# Patient Record
Sex: Female | Born: 1945 | Race: White | Hispanic: No | Marital: Married | Smoking: Current every day smoker
Health system: Southern US, Community
[De-identification: ages and names within clinical notes are randomized; demographics above are authoritative.]

## PROBLEM LIST (undated history)

## (undated) DIAGNOSIS — I712 Thoracic aortic aneurysm, without rupture: Secondary | ICD-10-CM

## (undated) DIAGNOSIS — M1990 Unspecified osteoarthritis, unspecified site: Secondary | ICD-10-CM

## (undated) DIAGNOSIS — M4712 Other spondylosis with myelopathy, cervical region: Secondary | ICD-10-CM

## (undated) DIAGNOSIS — J449 Chronic obstructive pulmonary disease, unspecified: Secondary | ICD-10-CM

## (undated) DIAGNOSIS — I10 Essential (primary) hypertension: Secondary | ICD-10-CM

## (undated) DIAGNOSIS — M5481 Occipital neuralgia: Secondary | ICD-10-CM

## (undated) DIAGNOSIS — E871 Hypo-osmolality and hyponatremia: Secondary | ICD-10-CM

## (undated) DIAGNOSIS — N189 Chronic kidney disease, unspecified: Secondary | ICD-10-CM

## (undated) HISTORY — DX: Other spondylosis with myelopathy, cervical region: M47.12

## (undated) HISTORY — DX: Occipital neuralgia: M54.81

## (undated) HISTORY — DX: Unspecified osteoarthritis, unspecified site: M19.90

## (undated) HISTORY — DX: Thoracic aortic aneurysm, without rupture: I71.2

## (undated) HISTORY — PX: BACK SURGERY: SHX140

## (undated) HISTORY — DX: Chronic obstructive pulmonary disease, unspecified: J44.9

## (undated) HISTORY — DX: Essential (primary) hypertension: I10

## (undated) HISTORY — PX: SPINAL FUSION: SHX223

## (undated) HISTORY — DX: Hypo-osmolality and hyponatremia: E87.1

---

## 1967-02-16 HISTORY — PX: CHOLECYSTECTOMY: SHX55

## 1997-05-03 ENCOUNTER — Ambulatory Visit (HOSPITAL_COMMUNITY): Admission: RE | Admit: 1997-05-03 | Discharge: 1997-05-03 | Payer: Self-pay | Admitting: Gastroenterology

## 1998-02-24 ENCOUNTER — Encounter: Payer: Self-pay | Admitting: Neurosurgery

## 1998-02-25 ENCOUNTER — Inpatient Hospital Stay (HOSPITAL_COMMUNITY): Admission: RE | Admit: 1998-02-25 | Discharge: 1998-02-26 | Payer: Self-pay | Admitting: Neurosurgery

## 1998-02-25 ENCOUNTER — Encounter: Payer: Self-pay | Admitting: Neurosurgery

## 1998-11-18 ENCOUNTER — Other Ambulatory Visit: Admission: RE | Admit: 1998-11-18 | Discharge: 1998-11-18 | Payer: Self-pay | Admitting: Obstetrics and Gynecology

## 1998-12-16 ENCOUNTER — Encounter: Payer: Self-pay | Admitting: Obstetrics and Gynecology

## 1998-12-16 ENCOUNTER — Encounter: Admission: RE | Admit: 1998-12-16 | Discharge: 1998-12-16 | Payer: Self-pay | Admitting: Obstetrics and Gynecology

## 2000-01-06 ENCOUNTER — Other Ambulatory Visit: Admission: RE | Admit: 2000-01-06 | Discharge: 2000-01-06 | Payer: Self-pay | Admitting: Obstetrics and Gynecology

## 2000-06-09 ENCOUNTER — Emergency Department (HOSPITAL_COMMUNITY): Admission: EM | Admit: 2000-06-09 | Discharge: 2000-06-09 | Payer: Self-pay | Admitting: Emergency Medicine

## 2001-02-15 HISTORY — PX: SPINAL FUSION: SHX223

## 2001-02-16 ENCOUNTER — Other Ambulatory Visit: Admission: RE | Admit: 2001-02-16 | Discharge: 2001-02-16 | Payer: Self-pay | Admitting: Obstetrics and Gynecology

## 2002-01-01 ENCOUNTER — Inpatient Hospital Stay (HOSPITAL_COMMUNITY)
Admission: RE | Admit: 2002-01-01 | Discharge: 2002-01-05 | Payer: Self-pay | Admitting: Physical Medicine & Rehabilitation

## 2005-05-17 ENCOUNTER — Ambulatory Visit (HOSPITAL_COMMUNITY): Admission: RE | Admit: 2005-05-17 | Discharge: 2005-05-17 | Payer: Self-pay | Admitting: Obstetrics and Gynecology

## 2005-05-28 ENCOUNTER — Encounter: Admission: RE | Admit: 2005-05-28 | Discharge: 2005-05-28 | Payer: Self-pay | Admitting: Neurosurgery

## 2005-06-02 ENCOUNTER — Encounter: Admission: RE | Admit: 2005-06-02 | Discharge: 2005-06-02 | Payer: Self-pay | Admitting: *Deleted

## 2005-09-15 ENCOUNTER — Other Ambulatory Visit: Admission: RE | Admit: 2005-09-15 | Discharge: 2005-09-15 | Payer: Self-pay | Admitting: Obstetrics and Gynecology

## 2006-12-22 ENCOUNTER — Encounter: Admission: RE | Admit: 2006-12-22 | Discharge: 2006-12-22 | Payer: Self-pay | Admitting: Neurosurgery

## 2007-11-12 ENCOUNTER — Encounter: Admission: RE | Admit: 2007-11-12 | Discharge: 2007-11-12 | Payer: Self-pay | Admitting: Neurosurgery

## 2007-11-19 ENCOUNTER — Encounter: Admission: RE | Admit: 2007-11-19 | Discharge: 2007-11-19 | Payer: Self-pay | Admitting: Neurosurgery

## 2010-01-20 ENCOUNTER — Ambulatory Visit: Payer: Self-pay | Admitting: Cardiology

## 2010-02-03 ENCOUNTER — Encounter: Payer: Self-pay | Admitting: Cardiology

## 2010-02-03 DIAGNOSIS — R609 Edema, unspecified: Secondary | ICD-10-CM

## 2010-02-04 ENCOUNTER — Encounter: Payer: Self-pay | Admitting: Cardiology

## 2010-02-04 ENCOUNTER — Ambulatory Visit (HOSPITAL_COMMUNITY)
Admission: RE | Admit: 2010-02-04 | Discharge: 2010-02-04 | Payer: Self-pay | Source: Home / Self Care | Attending: Cardiology | Admitting: Cardiology

## 2010-02-04 ENCOUNTER — Ambulatory Visit: Payer: Self-pay

## 2010-03-19 NOTE — Miscellaneous (Signed)
Summary: Orders Update  Clinical Lists Changes  Problems: Added new problem of PERIPHERAL EDEMA (ICD-782.3) Orders: Added new Test order of Venous Duplex Lower Extremity (Venous Duplex Lower) - Signed

## 2010-05-07 ENCOUNTER — Other Ambulatory Visit: Payer: Self-pay | Admitting: Neurosurgery

## 2010-05-07 DIAGNOSIS — M549 Dorsalgia, unspecified: Secondary | ICD-10-CM

## 2010-05-07 DIAGNOSIS — M542 Cervicalgia: Secondary | ICD-10-CM

## 2010-05-09 ENCOUNTER — Ambulatory Visit
Admission: RE | Admit: 2010-05-09 | Discharge: 2010-05-09 | Disposition: A | Discharge status: Home / Self Care | Payer: Medicare Other | Source: Ambulatory Visit | Attending: Neurosurgery | Admitting: Neurosurgery

## 2010-05-09 DIAGNOSIS — M542 Cervicalgia: Secondary | ICD-10-CM

## 2010-05-09 DIAGNOSIS — M549 Dorsalgia, unspecified: Secondary | ICD-10-CM

## 2010-07-03 NOTE — Discharge Summary (Signed)
NAME:  Regina [name], Regina [name]                        ACCOUNT NO.:  [id number]   MEDICAL RECORD NO.:  [id number]                   PATIENT TYPE:  IPS   LOCATION:  4032                                 FACILITY:  [facility]   PHYSICIAN:  Ellwood Dense, M.D.                DATE OF BIRTH:  03-01-1945   DATE OF ADMISSION:  01/01/2002  DATE OF DISCHARGE:  01/05/2002                                 DISCHARGE SUMMARY   DISCHARGE DIAGNOSES:  1. Cervical fusion redo from C1 to T1.  2. Enterobacter urinary tract infection.  3. Hypokalemia, resolved.   HISTORY OF PRESENT ILLNESS:  The patient is a 65 year old female with a  history of GERD, ___________ for approximately three years ago, who did well  initially, in the past year has had cervical myelopathy with quadriparesis,  cord compression and spondylolisthesis.  She elected to undergo posterior  cervical fusion from occiput to T1 with decompression and graft by Dr. Elvina Sidle  at [facility] on December 26, 2001.  Halo was placed  postop for stabilization.  Past issues and past surgeries have include  electrolyte abnormalities with hyponatremia as well as occasional headaches.  Per reports, halo to continue to remain in place for 10 to 12 weeks with  followup with Dr. Elvina Sidle in 6 to 8 weeks.  PT was initiated and the patient is  noted to have fine motor skill problems.  Motor strength is improving.  She  is minimal assist for transfers, has been ambulating 25 feet with rolling  walker with contact guard assist with narrow base of stance.   PAST MEDICAL HISTORY:  Past medical history significant for GERD, history of  DA with cervical myelopathy and instability, allergies and urinary  retention.   ALLERGIES:  Allergies are to [facility] and NICKEL.   SOCIAL HISTORY:  The patient is married and was working as a Engineer, civil (consulting) prior to  admission, was ambulating without assistive device.  She lives in a one-  level home with three steps to entry, smokes  one pack per day, has one to  two alcoholic drinks a month.   HOSPITAL COURSE:  The patient was admitted to rehab on January 01, 2002 for  inpatient therapies to consent of PT and OT daily.  Past admission, she was  maintained on subcu Lovenox for DVT prophylaxis.  Sodium chloride pills were  continued throughout her stay.  Labs done at admission showed sodium at 131,  potassium at 3.3.  CBC showed hemoglobin 10.7, hematocrit 31.5, white count  7.3, platelets 291,000.  The patient's pin sites were noted to be clean and  dry, no drainage and no infection noted.  Followup on electrolytes showed  the patient's hyponatremia to be slowly improving with sodium at 133,  potassium 4.1, chloride 96, CO2 30, BUN 4, creatinine 0.5, glucose 108.  She  was taken off sodium chloride pills at time of discharge.  UA/UCS done past  admission showed the patient to have 25,000 colonies of Enterobacter  aerogenes; she was treated with a three-day course of Cipro for this.  During her stay in rehab, the patient made good process.  She was close  supervision for transfers, close supervision for ambulating 200 feet with a  rolling walker.  She required minimal assist for one loss of balance.  She  is noted to have some impulsivity, especially with the use of a walker at 4  feet and in terms of safety.  She has refused to use her AFO that she was  advised to her years ago.  The patient was supervision for ADL needs.  Secondary to her impulsivity and problems with judgment, supervision is  recommended past discharge and family is to provide this.  Further followup  PT is to continue via [facility] past discharge.  On January 05, 2002, the patient is discharged to home.    DISCHARGE MEDICATIONS:  1. ____________ 0.4 mg q.h.s.  2. Percocet one to two every four to six hours p.r.n. pain.  3. Multivitamin one per day.  4. Protonix 40 mg b.i.d.  5. Cipro 250 mg b.i.d.   ACTIVITY:  Supervision  level.   DIET:  Regular.   WOUND CARE:  Keep clean ____________ tap water and hydrogen peroxide b.i.d.  basis and apply Betadine.   SPECIAL INSTRUCTIONS:  No alcohol, no smoking, no driving.   FOLLOWUP:  The patient is to follow up with Dr. Dorita Fray on Monday, follow  up with Dr. Lamar Benes as needed.      Greg Cutter, P.A.                    Ellwood Dense, M.D.    PP/MEDQ  D:  02/27/2002  T:  02/28/2002  Job:  [id number]   cc:   Marjory Lies, M.D.  [address]  [place]  [place] [address]  Fax: [phone]   Payton Doughty, M.D.  [address]  [place]  [place] [address]  Fax: [phone]   [facility] Elvina Sidle, Myna Hidalgo.D.

## 2011-07-02 ENCOUNTER — Other Ambulatory Visit: Payer: Self-pay | Admitting: Neurosurgery

## 2011-07-02 DIAGNOSIS — M4712 Other spondylosis with myelopathy, cervical region: Secondary | ICD-10-CM

## 2011-07-08 ENCOUNTER — Other Ambulatory Visit: Payer: PRIVATE HEALTH INSURANCE

## 2011-07-08 ENCOUNTER — Other Ambulatory Visit: Payer: Self-pay | Admitting: Neurosurgery

## 2011-07-08 ENCOUNTER — Ambulatory Visit
Admission: RE | Admit: 2011-07-08 | Discharge: 2011-07-08 | Disposition: A | Discharge status: Home / Self Care | Payer: Medicare Other | Source: Ambulatory Visit | Attending: Neurosurgery | Admitting: Neurosurgery

## 2011-07-08 ENCOUNTER — Ambulatory Visit
Admission: RE | Admit: 2011-07-08 | Discharge: 2011-07-08 | Disposition: A | Discharge status: Home / Self Care | Payer: PRIVATE HEALTH INSURANCE | Source: Ambulatory Visit | Attending: Neurosurgery | Admitting: Neurosurgery

## 2011-07-08 DIAGNOSIS — M4712 Other spondylosis with myelopathy, cervical region: Secondary | ICD-10-CM

## 2011-07-22 ENCOUNTER — Other Ambulatory Visit: Payer: Self-pay | Admitting: Neurosurgery

## 2011-07-22 DIAGNOSIS — D491 Neoplasm of unspecified behavior of respiratory system: Secondary | ICD-10-CM

## 2011-07-23 ENCOUNTER — Ambulatory Visit
Admission: RE | Admit: 2011-07-23 | Discharge: 2011-07-23 | Disposition: A | Discharge status: Home / Self Care | Payer: Medicare Other | Source: Ambulatory Visit | Attending: Neurosurgery | Admitting: Neurosurgery

## 2011-07-23 DIAGNOSIS — D491 Neoplasm of unspecified behavior of respiratory system: Secondary | ICD-10-CM

## 2011-08-09 ENCOUNTER — Other Ambulatory Visit: Payer: Self-pay | Admitting: Neurosurgery

## 2011-08-09 DIAGNOSIS — M5481 Occipital neuralgia: Secondary | ICD-10-CM

## 2011-08-23 ENCOUNTER — Ambulatory Visit
Admission: RE | Admit: 2011-08-23 | Discharge: 2011-08-23 | Disposition: A | Discharge status: Home / Self Care | Payer: Medicare Other | Source: Ambulatory Visit | Attending: Neurosurgery | Admitting: Neurosurgery

## 2011-08-23 DIAGNOSIS — I7121 Aneurysm of the ascending aorta, without rupture: Secondary | ICD-10-CM

## 2011-08-23 DIAGNOSIS — M5481 Occipital neuralgia: Secondary | ICD-10-CM | POA: Insufficient documentation

## 2011-08-23 DIAGNOSIS — I10 Essential (primary) hypertension: Secondary | ICD-10-CM | POA: Insufficient documentation

## 2011-08-23 DIAGNOSIS — J449 Chronic obstructive pulmonary disease, unspecified: Secondary | ICD-10-CM

## 2011-08-23 DIAGNOSIS — I712 Thoracic aortic aneurysm, without rupture: Secondary | ICD-10-CM

## 2011-08-23 DIAGNOSIS — M4712 Other spondylosis with myelopathy, cervical region: Secondary | ICD-10-CM | POA: Insufficient documentation

## 2011-08-23 HISTORY — DX: Chronic obstructive pulmonary disease, unspecified: J44.9

## 2011-08-23 HISTORY — DX: Aneurysm of the ascending aorta, without rupture: I71.21

## 2011-08-23 HISTORY — DX: Thoracic aortic aneurysm, without rupture: I71.2

## 2011-08-23 MED ORDER — DEXAMETHASONE SODIUM PHOSPHATE 4 MG/ML IJ SOLN
4.0000 mg | Freq: Once | INTRAMUSCULAR | Status: AC
  Administered 2011-08-23: 4 mg

## 2011-08-24 ENCOUNTER — Institutional Professional Consult (permissible substitution) (INDEPENDENT_AMBULATORY_CARE_PROVIDER_SITE_OTHER): Payer: Medicare Other | Admitting: Thoracic Surgery (Cardiothoracic Vascular Surgery)

## 2011-08-24 ENCOUNTER — Encounter: Payer: Self-pay | Admitting: Thoracic Surgery (Cardiothoracic Vascular Surgery)

## 2011-08-24 VITALS — BP 129/87 | HR 69 | Resp 18 | Ht 60.0 in | Wt <= 1120 oz

## 2011-08-24 DIAGNOSIS — I712 Thoracic aortic aneurysm, without rupture: Secondary | ICD-10-CM

## 2011-08-24 NOTE — Progress Notes (Signed)
PCP is Delorse Lek, MD Referring Provider is Trey Sailors, MD  Chief Complaint  Patient presents with  . Thoracic Aortic Aneurysm    Referral from Dr Channing Mutters for eval on ascending aorta aneurysm, Chest CT 07/23/11    HPI: 66 year old Regina [name] who presents with a complaint of aortic aneurysm.  Regina Regina [name] with a history of tobacco abuse, COPD, and degenerative disease of the spine with a previous spinal fusion. She recently had a CT to assess her spine it was incidentally noted to have a 3.9 cm descending aortic aneurysm. She denies any chest pain or shortness of breath. She did have an echocardiogram in 2011 which showed no evidence of valvular disease and also no evidence of significant aortic pathology at that time. She is very anxious about the aneurysm. She actually smokes 1.5 packs of cigarettes daily and expresses no interested in quitting.  Past Medical History  Diagnosis Date  . Hypertension   . Hyponatremia   . Ascending aortic aneurysm 08/23/2011  . COPD (chronic obstructive pulmonary disease) 08/23/2011  . Cervical spondylosis with myelopathy   . Occipital neuralgia     Past Surgical History  Procedure Date  . Back surgery     multiple  . Spinal fusion     cervical fusion    Family History  Problem Relation Age of Onset  . Heart attack Father   . Stroke Mother     Social History History  Substance Use Topics  . Smoking status: Current Everyday Smoker -- 1.0 packs/day for 48 years    Types: Cigarettes  . Smokeless tobacco: Not on file  . Alcohol Use: 7.0 oz/week    14 drink(s) per week    Current Outpatient Prescriptions  Medication Sig Dispense Refill  . acetaminophen (TYLENOL) 500 MG tablet Take 500 mg by mouth as needed.      Marland Kitchen aspirin-acetaminophen-caffeine (EXCEDRIN MIGRAINE) 250-250-65 MG per tablet Take 1 tablet by mouth as needed.      . Calcium Carbonate-Vitamin D (CALTRATE 600+D PO) Take 1 tablet by mouth.      . clorazepate (TRANXENE) 3.75 MG tablet  Take 3.75 mg by mouth 3 (three) times daily.       Marland Kitchen dextromethorphan-guaiFENesin (MUCINEX DM) 30-600 MG per 12 hr tablet Take 1 tablet by mouth every 12 (twelve) hours.      . Multiple Vitamin (MULITIVITAMIN WITH MINERALS) TABS Take 1 tablet by mouth daily.       No current facility-administered medications for this visit.   Facility-Administered Medications Ordered in Other Visits  Medication Dose Route Frequency Provider Last Rate Last Dose  . dexamethasone (DECADRON) injection 4 mg  4 mg Other Once Elsie Stain, MD   4 mg at 08/23/11 1018    Allergies  Allergen Reactions  . Erythromycin Rash    Review of Systems  Constitutional:       Sedentary, does not exercise  Respiratory: Negative for shortness of breath and wheezing.   Cardiovascular: Negative for chest pain and leg swelling.  Gastrointestinal:       Difficulty swallowing- esophageal dilatation by Dr. Arlyce Dice  Genitourinary: Positive for urgency and frequency.       "leaky bladder"  Musculoskeletal: Positive for back pain and arthralgias.  All other systems reviewed and are negative.    BP 129/87  Pulse 69  Resp 18  Ht 5' (1.524 m)  Wt 64 lb (29.03 kg)  BMI 12.50 kg/m2  SpO2 92% Physical Exam  Vitals reviewed. Constitutional: She is  oriented to person, place, and time. No distress.       Thin, frail  HENT:  Head: Normocephalic and atraumatic.  Eyes: EOM are normal. Pupils are equal, round, and reactive to light.  Neck: No thyromegaly present.       Limited ROM  Cardiovascular: Normal rate, regular rhythm, normal heart sounds and intact distal pulses.   No murmur heard. Pulmonary/Chest: She has no wheezes.       Diminished BS bilaterally  Abdominal: Soft. There is no tenderness.  Musculoskeletal: She exhibits no edema.  Lymphadenopathy:    She has no cervical adenopathy.  Neurological: She is alert and oriented to person, place, and time. No cranial nerve deficit.  Skin: Skin is warm and dry.      Diagnostic Tests: CT of chest from 07/23/2011 reviewed  IMPRESSION:  Severe COPD changes. Biapical pleural/parenchymal densities, right  slightly greater than left. I favor scarring. This can be  followed with repeat chest CT in 6 months to assure stability.  4.0 cm ascending aortic aneurysm.  Coronary artery disease.   Impression: 66 year old Regina [name] with a 4 cm ascending aortic aneurysm. This does not meet size criteria for surgical resection. At this point her risk from surgery would far outweigh the risk from the aneurysm. Normally in the ascending aorta we would wait until this was 5.5-6 cm before recommending surgery. However she is very small and if her aneurysm to 5 cm, I would be very concerned. She is a poor surgical candidate given her small size, cachexia, and COPD with ongoing tobacco abuse. I did talk with her about quitting smoking. She expressed no interest whatsoever.  The radiologist also noted pleural and parenchymal densities in both apices and recommended a CT followup in 6 months. We'll plan to do a CT Angiogram at that time to assess both the apices and the ascending aortic aneurysm.  Plan: Return in 6 months with CT angiogram of the chest.

## 2012-01-25 ENCOUNTER — Other Ambulatory Visit (HOSPITAL_COMMUNITY): Payer: Self-pay | Admitting: Family Medicine

## 2012-01-25 DIAGNOSIS — M8580 Other specified disorders of bone density and structure, unspecified site: Secondary | ICD-10-CM

## 2012-01-25 DIAGNOSIS — Z1231 Encounter for screening mammogram for malignant neoplasm of breast: Secondary | ICD-10-CM

## 2012-02-17 ENCOUNTER — Other Ambulatory Visit: Payer: Self-pay | Admitting: Thoracic Surgery (Cardiothoracic Vascular Surgery)

## 2012-02-17 DIAGNOSIS — I712 Thoracic aortic aneurysm, without rupture: Secondary | ICD-10-CM

## 2012-02-18 ENCOUNTER — Other Ambulatory Visit: Payer: Self-pay | Admitting: Thoracic Surgery (Cardiothoracic Vascular Surgery)

## 2012-02-18 ENCOUNTER — Other Ambulatory Visit: Payer: Self-pay

## 2012-02-18 DIAGNOSIS — I712 Thoracic aortic aneurysm, without rupture: Secondary | ICD-10-CM

## 2012-02-23 ENCOUNTER — Other Ambulatory Visit: Payer: Self-pay | Admitting: Neurosurgery

## 2012-02-23 DIAGNOSIS — M5481 Occipital neuralgia: Secondary | ICD-10-CM

## 2012-02-25 ENCOUNTER — Ambulatory Visit (HOSPITAL_COMMUNITY)
Admission: RE | Admit: 2012-02-25 | Discharge: 2012-02-25 | Disposition: A | Discharge status: Home / Self Care | Payer: Medicare Other | Source: Ambulatory Visit | Attending: Family Medicine | Admitting: Family Medicine

## 2012-02-25 DIAGNOSIS — Z1231 Encounter for screening mammogram for malignant neoplasm of breast: Secondary | ICD-10-CM | POA: Insufficient documentation

## 2012-02-25 DIAGNOSIS — M949 Disorder of cartilage, unspecified: Secondary | ICD-10-CM | POA: Insufficient documentation

## 2012-02-25 DIAGNOSIS — M899 Disorder of bone, unspecified: Secondary | ICD-10-CM | POA: Insufficient documentation

## 2012-02-25 DIAGNOSIS — Z780 Asymptomatic menopausal state: Secondary | ICD-10-CM | POA: Insufficient documentation

## 2012-02-25 DIAGNOSIS — M8580 Other specified disorders of bone density and structure, unspecified site: Secondary | ICD-10-CM

## 2012-02-25 DIAGNOSIS — Z13820 Encounter for screening for osteoporosis: Secondary | ICD-10-CM | POA: Insufficient documentation

## 2012-03-01 ENCOUNTER — Other Ambulatory Visit: Payer: Self-pay | Admitting: Neurosurgery

## 2012-03-01 ENCOUNTER — Ambulatory Visit
Admission: RE | Admit: 2012-03-01 | Discharge: 2012-03-01 | Disposition: A | Discharge status: Home / Self Care | Payer: Medicare Other | Source: Ambulatory Visit | Attending: Neurosurgery | Admitting: Neurosurgery

## 2012-03-01 DIAGNOSIS — M5481 Occipital neuralgia: Secondary | ICD-10-CM

## 2012-03-01 MED ORDER — IOHEXOL 180 MG/ML  SOLN
1.0000 mL | Freq: Once | INTRAMUSCULAR | Status: AC | PRN
  Administered 2012-03-01: 1 mL

## 2012-03-01 MED ORDER — DEXAMETHASONE SODIUM PHOSPHATE 4 MG/ML IJ SOLN
6.0000 mg | Freq: Once | INTRAMUSCULAR | Status: AC
  Administered 2012-03-01: 6 mg

## 2012-03-02 ENCOUNTER — Other Ambulatory Visit: Payer: Self-pay | Admitting: Thoracic Surgery (Cardiothoracic Vascular Surgery)

## 2012-03-02 LAB — BUN: BUN: 7 mg/dL (ref 6–23)

## 2012-03-07 ENCOUNTER — Encounter: Payer: Self-pay | Admitting: Thoracic Surgery (Cardiothoracic Vascular Surgery)

## 2012-03-07 ENCOUNTER — Ambulatory Visit
Admission: RE | Admit: 2012-03-07 | Discharge: 2012-03-07 | Disposition: A | Discharge status: Home / Self Care | Payer: Medicare Other | Source: Ambulatory Visit | Attending: Thoracic Surgery (Cardiothoracic Vascular Surgery) | Admitting: Thoracic Surgery (Cardiothoracic Vascular Surgery)

## 2012-03-07 ENCOUNTER — Ambulatory Visit (INDEPENDENT_AMBULATORY_CARE_PROVIDER_SITE_OTHER): Payer: Medicare Other | Admitting: Thoracic Surgery (Cardiothoracic Vascular Surgery)

## 2012-03-07 VITALS — BP 130/95 | HR 62 | Resp 16 | Ht 60.0 in | Wt <= 1120 oz

## 2012-03-07 DIAGNOSIS — I712 Thoracic aortic aneurysm, without rupture, unspecified: Secondary | ICD-10-CM

## 2012-03-07 MED ORDER — IOHEXOL 350 MG/ML SOLN
75.0000 mL | Freq: Once | INTRAVENOUS | Status: AC | PRN
  Administered 2012-03-07: 75 mL via INTRAVENOUS

## 2012-03-07 NOTE — Progress Notes (Signed)
HPI:  Liddicoat returns today for followup of an ascending aortic aneurysm. This was found incidentally on CT scan of her spine 6 months ago. That led to a CT of the chest which showed an ascending aortic aneurysm with maximal diameter of 3.9 cm. She now returns for 6 month followup. She said that since her last visit she's not had any chest pain. She does have issues related to bone density and apparently is going to have some intravenous therapy for that.  Past Medical History  Diagnosis Date  . Hypertension   . Hyponatremia   . Ascending aortic aneurysm 08/23/2011  . COPD (chronic obstructive pulmonary disease) 08/23/2011  . Cervical spondylosis with myelopathy   . Occipital neuralgia      Current Outpatient Prescriptions  Medication Sig Dispense Refill  . acetaminophen-codeine (TYLENOL #3) 300-30 MG per tablet Take 1 tablet by mouth every 6 (six) hours as needed.      Marland Kitchen aspirin-acetaminophen-caffeine (EXCEDRIN MIGRAINE) 250-250-65 MG per tablet Take 1 tablet by mouth as needed.      . Calcium Carbonate-Vitamin D (CALTRATE 600+D PO) Take 1 tablet by mouth.      . cholecalciferol (VITAMIN D) 1000 UNITS tablet Take 1,000 Units by mouth daily.      . clorazepate (TRANXENE) 3.75 MG tablet Take 3.75 mg by mouth 3 (three) times daily.       Marland Kitchen dextromethorphan-guaiFENesin (MUCINEX DM) 30-600 MG per 12 hr tablet Take 1 tablet by mouth every 12 (twelve) hours.      . Multiple Vitamin (MULITIVITAMIN WITH MINERALS) TABS Take 1 tablet by mouth daily.        Physical Exam BP 130/95  Pulse 62  Resp 16  Ht 5' (1.524 m)  Wt 64 lb (29.03 kg)  BMI 12.50 kg/m2 Frail-appearing 67 year old woman in no acute distress Neurologic alert and oriented x3 Cardiac regular rate and rhythm normal S1 and S2 Lungs diminished breath sounds bilaterally, no wheezes  Diagnostic Tests: CT angiogram of chest 03/10/2012  CT ANGIOGRAPHY CHEST  Technique: Multidetector CT imaging of the chest using the  standard  protocol during bolus administration of intravenous  contrast. Multiplanar reconstructed images including MIPs were  obtained and reviewed to evaluate the vascular anatomy.  Contrast: 75mL OMNIPAQUE IOHEXOL 350 MG/ML SOLN  Comparison: 07/23/2011  Findings: Ascending aorta 3 cm at the sinotubular junction, 4 cm  proximal ascending ( previously 4 cm), 3.7 cm distal ascending, 3.2  cm proximal arch, 2.6 cm distal arch, 2.6 cm proximal descending,  2.1 cm distal descending just above the diaphragm. There is mild  atheromatous irregularity in the proximal and distal descending  segments. No dissection or stenosis. Patchy coronary  calcifications. Wide patency of brachiocephalic arterial origins.  The left vertebral artery originates directly from the aortic arch,  an anatomic variant.  There is good contrast opacification of the pulmonary artery  branches; exam was not optimized for detection of pulmonary emboli.  No hilar or mediastinal adenopathy. No pleural or pericardial  effusion. Lungs are hyperinflated. There is some subpleural  scarring or subsegmental atelectasis posteriorly in both lower  lobes. Lungs otherwise clear. Mild central intrahepatic biliary  ductal dilatation is noted, incompletely visualized. The remainder  of the visualized upper abdomen is unremarkable. Thoracic spine  and sternum sternum intact.  IMPRESSION:  1. Stable 4 cm ascending aortic aneurysm.  2. Central intrahepatic biliary ductal dilatation, incompletely  visualized. Correlate with any clinical or laboratory evidence  of biliary obstruction.  Original Report Authenticated By:  Corlis Leak III, MD  Impression: 67 year old woman with a known 3.9-4 cm ascending aortic aneurysm. There is been no interval change in the size or appearance of the aorta in 6 months. I would recommend a followup in one year.   Plan: Return in one year with MRA of the chest.

## 2012-03-07 NOTE — Patient Instructions (Signed)
Return in one year with an MR angiogram of the chest

## 2012-06-01 ENCOUNTER — Encounter: Payer: Self-pay | Admitting: Cardiology

## 2012-06-01 ENCOUNTER — Ambulatory Visit (INDEPENDENT_AMBULATORY_CARE_PROVIDER_SITE_OTHER): Payer: Medicare Other | Admitting: Cardiology

## 2012-06-01 VITALS — BP 148/98 | HR 78 | Ht 60.0 in | Wt <= 1120 oz

## 2012-06-01 DIAGNOSIS — R609 Edema, unspecified: Secondary | ICD-10-CM

## 2012-06-01 DIAGNOSIS — I872 Venous insufficiency (chronic) (peripheral): Secondary | ICD-10-CM

## 2012-06-01 NOTE — Patient Instructions (Signed)
Wear thigh high compression hose.  Restrict sodium intake.  Keep your feet elevated as much as possible  I will see you as needed.

## 2012-06-01 NOTE — Progress Notes (Signed)
Regina [name] Date of Birth: 1945-11-23 Medical Record #[id number]  History of Present Illness: Regina [name] is seen at the request of Dr. Doristine Counter for evaluation of edema. She is a pleasant 67 year old white female who has a history of COPD and hyponatremia. I'd seen her previously in 2011 for the same complaint. At that time her renal function, liver function, and protein stores were normal. Echocardiogram was normal. Venous Doppler showed no evidence of DVT. Conservative measures were recommended. Apparently her edema did improve for a period of time. She reports now that since early large she has had increased swelling particularly in her left leg. It is painful. She has no erythema or increased warmth. No fever or chills. No recent injury. She reports that she restricts her salt intake. She continues to smoke and has no interest in quitting.  Current Outpatient Prescriptions on File Prior to Visit  Medication Sig Dispense Refill  . acetaminophen-codeine (TYLENOL #3) 300-30 MG per tablet Take 1 tablet by mouth every 6 (six) hours as needed.      Marland Kitchen aspirin-acetaminophen-caffeine (EXCEDRIN MIGRAINE) 250-250-65 MG per tablet Take 1 tablet by mouth as needed.      . Calcium Carbonate-Vitamin D (CALTRATE 600+D PO) Take 1 tablet by mouth.      . cholecalciferol (VITAMIN D) 1000 UNITS tablet Take 1,000 Units by mouth daily.      . clorazepate (TRANXENE) 3.75 MG tablet Take 3.75 mg by mouth 3 (three) times daily.       Marland Kitchen dextromethorphan-guaiFENesin (MUCINEX DM) 30-600 MG per 12 hr tablet Take 1 tablet by mouth every 12 (twelve) hours.      . Multiple Vitamin (MULITIVITAMIN WITH MINERALS) TABS Take 1 tablet by mouth daily.       No current facility-administered medications on file prior to visit.    Allergies  Allergen Reactions  . Erythromycin Rash    Past Medical History  Diagnosis Date  . Hypertension   . Hyponatremia   . Ascending aortic aneurysm 08/23/2011  . COPD (chronic obstructive  pulmonary disease) 08/23/2011  . Cervical spondylosis with myelopathy   . Occipital neuralgia   . Arthritis     Past Surgical History  Procedure Laterality Date  . Back surgery      multiple  . Spinal fusion      cervical fusion    History  Smoking status  . Current Every Day Smoker -- 1.00 packs/day for 48 years  . Types: Cigarettes  Smokeless tobacco  . Not on file    History  Alcohol Use  . 7.0 oz/week  . 14 drink(s) per week    Family History  Problem Relation Age of Onset  . Heart attack Father   . Stroke Mother     Review of Systems: The review of systems is positive for chronic arthralgias of the spine. She also has urinary incontinence.  All other systems were reviewed and are negative.  Physical Exam: BP 148/98  Pulse 78  Ht 5' (1.524 m)  Wt 67 lb 12.8 oz (30.754 kg)  BMI 13.24 kg/m2 She is a frail, very thin white female who appears older than her stated age. HEENT: Atraumatic. Pupils equal round and reactive. Sclera clear. Oropharynx is clear. Neck is without jugular venous distention or bruits. There is no thyromegaly or lymphadenopathy. Lungs: Clear Cardiovascular: Regular rate and rhythm, normal S1 and S2, no gallop or murmur. Abdomen: Soft and nontender. No masses or hepatosplenomegaly. No ascites. Extremities: 1-2+ edema bilaterally below the knees.  Left greater than right. No erythema. Chronic scar on her left shin. Pedal pulses are palpable. Skin: Warm and dry Neuro: Alert and oriented x3. Cranial nerves II through XII are intact.  LABORATORY DATA:   Assessment / Plan: 1. Lower extremity edema. This is due to chronic venous insufficiency. Similar presentation to 2011. No other indication of congestive heart failure. Exam is not consistent with DVT. I recommended conservative therapy with sodium restriction, elevation of her feet, and thigh-high compression stockings. I think she is a poor candidate for diuretic therapy given her chronic  hyponatremia and incontinence. I don't feel that further evaluation with echocardiogram or venous Dopplers is necessary at this point. I'll see as needed.

## 2012-07-19 ENCOUNTER — Other Ambulatory Visit: Payer: Self-pay | Admitting: Neurosurgery

## 2012-07-19 DIAGNOSIS — M4712 Other spondylosis with myelopathy, cervical region: Secondary | ICD-10-CM

## 2012-07-26 ENCOUNTER — Ambulatory Visit
Admission: RE | Admit: 2012-07-26 | Discharge: 2012-07-26 | Disposition: A | Discharge status: Home / Self Care | Payer: Medicare Other | Source: Ambulatory Visit | Attending: Neurosurgery | Admitting: Neurosurgery

## 2012-07-26 DIAGNOSIS — M4712 Other spondylosis with myelopathy, cervical region: Secondary | ICD-10-CM

## 2012-08-31 ENCOUNTER — Inpatient Hospital Stay (HOSPITAL_COMMUNITY)
Admission: AD | Admit: 2012-08-31 | Discharge: 2012-09-03 | DRG: 193 | Disposition: A | Discharge status: Home / Self Care | Payer: Medicare Other | Source: Ambulatory Visit | Attending: Internal Medicine | Admitting: Internal Medicine

## 2012-08-31 ENCOUNTER — Encounter (HOSPITAL_COMMUNITY): Payer: Self-pay | Admitting: General Practice

## 2012-08-31 DIAGNOSIS — I129 Hypertensive chronic kidney disease with stage 1 through stage 4 chronic kidney disease, or unspecified chronic kidney disease: Secondary | ICD-10-CM | POA: Diagnosis present

## 2012-08-31 DIAGNOSIS — N189 Chronic kidney disease, unspecified: Secondary | ICD-10-CM | POA: Diagnosis present

## 2012-08-31 DIAGNOSIS — Z681 Body mass index (BMI) 19 or less, adult: Secondary | ICD-10-CM

## 2012-08-31 DIAGNOSIS — F5000 Anorexia nervosa, unspecified: Secondary | ICD-10-CM | POA: Diagnosis present

## 2012-08-31 DIAGNOSIS — E43 Unspecified severe protein-calorie malnutrition: Secondary | ICD-10-CM | POA: Diagnosis present

## 2012-08-31 DIAGNOSIS — I712 Thoracic aortic aneurysm, without rupture, unspecified: Secondary | ICD-10-CM | POA: Diagnosis present

## 2012-08-31 DIAGNOSIS — M5481 Occipital neuralgia: Secondary | ICD-10-CM

## 2012-08-31 DIAGNOSIS — J449 Chronic obstructive pulmonary disease, unspecified: Secondary | ICD-10-CM

## 2012-08-31 DIAGNOSIS — F17200 Nicotine dependence, unspecified, uncomplicated: Secondary | ICD-10-CM

## 2012-08-31 DIAGNOSIS — I10 Essential (primary) hypertension: Secondary | ICD-10-CM

## 2012-08-31 DIAGNOSIS — F411 Generalized anxiety disorder: Secondary | ICD-10-CM | POA: Diagnosis present

## 2012-08-31 DIAGNOSIS — G894 Chronic pain syndrome: Secondary | ICD-10-CM

## 2012-08-31 DIAGNOSIS — M4712 Other spondylosis with myelopathy, cervical region: Secondary | ICD-10-CM

## 2012-08-31 DIAGNOSIS — Z981 Arthrodesis status: Secondary | ICD-10-CM

## 2012-08-31 DIAGNOSIS — J189 Pneumonia, unspecified organism: Principal | ICD-10-CM | POA: Diagnosis present

## 2012-08-31 DIAGNOSIS — J441 Chronic obstructive pulmonary disease with (acute) exacerbation: Secondary | ICD-10-CM | POA: Diagnosis present

## 2012-08-31 DIAGNOSIS — I872 Venous insufficiency (chronic) (peripheral): Secondary | ICD-10-CM

## 2012-08-31 DIAGNOSIS — F17210 Nicotine dependence, cigarettes, uncomplicated: Secondary | ICD-10-CM | POA: Diagnosis present

## 2012-08-31 DIAGNOSIS — R609 Edema, unspecified: Secondary | ICD-10-CM

## 2012-08-31 DIAGNOSIS — M129 Arthropathy, unspecified: Secondary | ICD-10-CM | POA: Diagnosis present

## 2012-08-31 HISTORY — DX: Chronic kidney disease, unspecified: N18.9

## 2012-08-31 LAB — COMPREHENSIVE METABOLIC PANEL
ALT: 11 U/L (ref 0–35)
AST: 18 U/L (ref 0–37)
Albumin: 2.9 g/dL — ABNORMAL LOW (ref 3.5–5.2)
CO2: 27 mEq/L (ref 19–32)
Calcium: 9.8 mg/dL (ref 8.4–10.5)
Chloride: 97 mEq/L (ref 96–112)
GFR calc non Af Amer: >90 mL/min (ref >90)
Sodium: 131 mEq/L — ABNORMAL LOW (ref 135–145)
Total Bilirubin: 0.6 mg/dL (ref 0.3–1.2)

## 2012-08-31 LAB — CBC WITH DIFFERENTIAL/PLATELET
Basophils Relative: 0 % (ref 0–1)
HCT: 42.4 % (ref 36.0–46.0)
Hemoglobin: 14.3 g/dL (ref 12.0–15.0)
Lymphs Abs: 0.6 10*3/uL — ABNORMAL LOW (ref 0.7–4.0)
MCHC: 33.7 g/dL (ref 30.0–36.0)
Monocytes Absolute: 0.6 10*3/uL (ref 0.1–1.0)
Monocytes Relative: 7 % (ref 3–12)
Neutro Abs: 7.5 10*3/uL (ref 1.7–7.7)
Neutrophils Relative %: 86 % — ABNORMAL HIGH (ref 43–77)
RBC: 4.87 MIL/uL (ref 3.87–5.11)

## 2012-08-31 LAB — PHOSPHORUS: Phosphorus: 2.7 mg/dL (ref 2.3–4.6)

## 2012-08-31 MED ORDER — CALCIUM CARBONATE-VITAMIN D 500-200 MG-UNIT PO TABS
ORAL_TABLET | Freq: Every day | ORAL | Status: DC
  Administered 2012-08-31: 1 via ORAL
  Filled 2012-08-31 (×4): qty 1

## 2012-08-31 MED ORDER — ACETAMINOPHEN-CODEINE #3 300-30 MG PO TABS: 1.0000 | ORAL_TABLET | Freq: Four times a day (QID) | ORAL | Status: DC | PRN

## 2012-08-31 MED ORDER — ALBUTEROL SULFATE (5 MG/ML) 0.5% IN NEBU: 2.5000 mg | INHALATION_SOLUTION | RESPIRATORY_TRACT | Status: DC

## 2012-08-31 MED ORDER — LEVOFLOXACIN IN D5W 250 MG/50ML IV SOLN: 250.0000 mg | INTRAVENOUS | Status: DC

## 2012-08-31 MED ORDER — VITAMIN D3 25 MCG (1000 UNIT) PO TABS
1000.0000 [IU] | ORAL_TABLET | Freq: Every day | ORAL | Status: DC
  Administered 2012-08-31 – 2012-09-02 (×3): 1000 [IU] via ORAL
  Filled 2012-08-31 (×4): qty 1

## 2012-08-31 MED ORDER — ENOXAPARIN SODIUM 40 MG/0.4ML ~~LOC~~ SOLN: 40.0000 mg | SUBCUTANEOUS | Status: DC

## 2012-08-31 MED ORDER — SODIUM CHLORIDE 0.9 % IV SOLN
INTRAVENOUS | Status: DC
  Administered 2012-08-31: 19:00:00 via INTRAVENOUS

## 2012-08-31 MED ORDER — DM-GUAIFENESIN ER 30-600 MG PO TB12
1.0000 | ORAL_TABLET | Freq: Two times a day (BID) | ORAL | Status: DC
  Administered 2012-09-01 – 2012-09-02 (×4): 1 via ORAL
  Filled 2012-08-31 (×7): qty 1

## 2012-08-31 MED ORDER — LEVOFLOXACIN IN D5W 500 MG/100ML IV SOLN
500.0000 mg | Freq: Once | INTRAVENOUS | Status: AC
  Administered 2012-08-31: 500 mg via INTRAVENOUS
  Filled 2012-08-31: qty 100

## 2012-08-31 MED ORDER — IPRATROPIUM BROMIDE 0.02 % IN SOLN: 0.5000 mg | Freq: Four times a day (QID) | RESPIRATORY_TRACT | Status: DC

## 2012-08-31 MED ORDER — ASPIRIN-ACETAMINOPHEN-CAFFEINE 250-250-65 MG PO TABS
1.0000 | ORAL_TABLET | Freq: Three times a day (TID) | ORAL | Status: DC | PRN
  Filled 2012-08-31: qty 1

## 2012-08-31 MED ORDER — ENOXAPARIN SODIUM 30 MG/0.3ML ~~LOC~~ SOLN
30.0000 mg | SUBCUTANEOUS | Status: DC
  Filled 2012-08-31 (×2): qty 0.3

## 2012-08-31 MED ORDER — PREDNISONE 50 MG PO TABS
50.0000 mg | ORAL_TABLET | Freq: Every day | ORAL | Status: AC
  Administered 2012-08-31 – 2012-09-02 (×3): 50 mg via ORAL
  Filled 2012-08-31 (×3): qty 1

## 2012-08-31 MED ORDER — ALBUTEROL SULFATE (5 MG/ML) 0.5% IN NEBU
2.5000 mg | INHALATION_SOLUTION | Freq: Four times a day (QID) | RESPIRATORY_TRACT | Status: DC
  Administered 2012-08-31 – 2012-09-01 (×2): 2.5 mg via RESPIRATORY_TRACT
  Filled 2012-08-31 (×3): qty 0.5

## 2012-08-31 MED ORDER — LEVOFLOXACIN IN D5W 750 MG/150ML IV SOLN: 750.0000 mg | INTRAVENOUS | Status: DC

## 2012-08-31 MED ORDER — LEVOFLOXACIN IN D5W 250 MG/50ML IV SOLN
250.0000 mg | INTRAVENOUS | Status: DC
  Filled 2012-08-31: qty 50

## 2012-08-31 MED ORDER — LEVOFLOXACIN IN D5W 250 MG/50ML IV SOLN
250.0000 mg | INTRAVENOUS | Status: DC
  Administered 2012-09-01 – 2012-09-02 (×2): 250 mg via INTRAVENOUS
  Filled 2012-08-31 (×2): qty 50

## 2012-08-31 MED ORDER — CLORAZEPATE DIPOTASSIUM 3.75 MG PO TABS
3.7500 mg | ORAL_TABLET | Freq: Three times a day (TID) | ORAL | Status: DC
  Administered 2012-08-31 – 2012-09-02 (×7): 3.75 mg via ORAL
  Filled 2012-08-31 (×7): qty 1

## 2012-08-31 MED ORDER — IPRATROPIUM BROMIDE 0.02 % IN SOLN
0.5000 mg | Freq: Four times a day (QID) | RESPIRATORY_TRACT | Status: DC
  Administered 2012-08-31 – 2012-09-01 (×2): 0.5 mg via RESPIRATORY_TRACT
  Filled 2012-08-31 (×3): qty 2.5

## 2012-08-31 NOTE — H&P (Signed)
[facility] History and Physical  Regina [name] OZH:[id number] DOB: 09-01-1945 DOA: 08/31/2012  Referring physician: Dr. Marjory Lies PCP: Delorse Lek, MD  Specialists: Chief Complaint: Fatigue, weakness  HPI: Regina [name] is a 68 y.o. WF PMHx S/P  Spinal fusion 2000/neck fusion 2003 with residual lower extremity weakness, Anxiety, Arthritis, COPD(on no Tx), Anorexia, Fatigue, HTN, Osteopenia, Chronic Pain (Multiple Back Surgeries). Presented to Dr Marjory Lies approx (2) wks ago w/ Inc fatigue, (+) chills. States started on antibiotics Cefdinir x1 week without improvement. Negative sick contacts, negative travel, states had to be wheeled in the parking not to the distance office in a wheelchair secondary to fatigue. Currently positive chills worsening cough, negative sob, positive weakness, CXR today which shows left sided PNA.  Review of Systems: The patient denies ,, vision loss, decreased hearing, hoarseness, chest pain, syncope, dyspnea on exertion, peripheral edema, balance deficits, hemoptysis, abdominal pain, melena, hematochezia, severe indigestion/heartburn, hematuria, incontinence, genital sores, muscle weakness, suspicious skin lesions, transient blindness, , depression, unusual weight change, abnormal bleeding, enlarged lymph nodes, angioedema, and breast masses.    Past Medical History  Diagnosis Date  . Hypertension   . Hyponatremia   . Ascending aortic aneurysm 08/23/2011  . COPD (chronic obstructive pulmonary disease) 08/23/2011  . Cervical spondylosis with myelopathy   . Occipital neuralgia   . Arthritis   . Chronic kidney disease     urine incontinence   Past Surgical History  Procedure Laterality Date  . Back surgery      multiple  . Spinal fusion      cervical fusion  . Spinal fusion  2003   Social History:  (+) smokes 1 PPD x 50 yr, (+) ETOH, drinks about 7.0 ounces of alcohol per day, (-) Drugs.   where does patient live--home.  Can patient participate  in ADLs; yes   Allergies  Allergen Reactions  . Erythromycin Rash    Family History  Problem Relation Age of Onset  . Heart attack Father   . Stroke Mother      Prior to Admission medications   Medication Sig Start Date End Date Taking? Authorizing Provider  acetaminophen-codeine (TYLENOL #3) 300-30 MG per tablet Take 1 tablet by mouth every 6 (six) hours as needed.    Historical Provider, MD  aspirin-acetaminophen-caffeine (EXCEDRIN MIGRAINE) [phone] MG per tablet Take 1 tablet by mouth as needed.    Historical Provider, MD  Calcium Carbonate-Vitamin D (CALTRATE 600+D PO) Take 1 tablet by mouth.    Historical Provider, MD  cholecalciferol (VITAMIN D) 1000 UNITS tablet Take 1,000 Units by mouth daily.    Historical Provider, MD  clorazepate (TRANXENE) 3.75 MG tablet Take 3.75 mg by mouth 3 (three) times daily.     Historical Provider, MD  dextromethorphan-guaiFENesin (MUCINEX DM) 30-600 MG per 12 hr tablet Take 1 tablet by mouth every 12 (twelve) hours.    Historical Provider, MD  Multiple Vitamin (MULITIVITAMIN WITH MINERALS) TABS Take 1 tablet by mouth daily.    Historical Provider, MD   Physical Exam: Filed Vitals:   08/31/12 1536  BP: 126/89  Pulse: 75  Temp: 98.5 F (36.9 C)  TempSrc: Oral  Resp: 20  Height: 5' (1.524 m)  Weight: 27.805 kg (61 lb 4.8 oz)  SpO2: 96%     General: Alert, mildly uncooperative,NAD  Cardiovascular: Regular rhythm and rate, negative murmurs rubs or gallops,  Respiratory: Poor air movement bibasilar, positive expiratory wheezing diffuse left upper lobe rhonchi  Abdomen: Soft nontender nondistended plus bowel sounds  Labs on Admission:  Basic Metabolic Panel: No results found for this basename: NA, K, CL, CO2, GLUCOSE, BUN, CREATININE, CALCIUM, MG, PHOS,  in the last 168 hours Liver Function Tests: No results found for this basename: AST, ALT, ALKPHOS, BILITOT, PROT, ALBUMIN,  in the last 168 hours No results found for this  basename: LIPASE, AMYLASE,  in the last 168 hours No results found for this basename: AMMONIA,  in the last 168 hours CBC: No results found for this basename: WBC, NEUTROABS, HGB, HCT, MCV, PLT,  in the last 168 hours Cardiac Enzymes: No results found for this basename: CKTOTAL, CKMB, CKMBINDEX, TROPONINI,  in the last 168 hours  BNP (last 3 results) No results found for this basename: PROBNP,  in the last 8760 hours CBG: No results found for this basename: GLUCAP,  in the last 168 hours  Radiological Exams on Admission: No results found.  EKG: Pending  Assessment/Plan Principal Problem:   Pneumonia Active Problems:   Ascending aortic aneurysm   Hypertension   COPD (chronic obstructive pulmonary disease)   Chronic pain syndrome   Anxiety state, unspecified   Tobacco dependence   Anorexia nervosa   Severe malnutrition   1. Pneumonia; start patient on levofloxacin day 1/7. Steroid burst. 50 mg x3 days. Mucinex DM 2. COPD; start patient on DuoNeb every 4 times a day 3. HTN; start patient on her home meds 4. Malnutrition; place referral for nutrition consult patient weighs 65 pounds. Have drawn malnutrition lab. 5. Anorexia; consider starting patient on Megace after nutrition consult in 6. Chronic pain; continue home meds (Tylenol #3) 7. Anxiety; continue home meds (clorazepate)   Code Status: Full Family Communication: Care plan discussed with patient and husband Disposition Plan: ?  Time spent: 75 minutes  WOODS, Regina [name] [facility] Pager [phone]  If 7PM-7AM, please contact night-coverage [email or web address] Password [facility] 08/31/2012, 5:43 PM

## 2012-09-01 ENCOUNTER — Inpatient Hospital Stay (HOSPITAL_COMMUNITY): Payer: Medicare Other

## 2012-09-01 DIAGNOSIS — J189 Pneumonia, unspecified organism: Principal | ICD-10-CM

## 2012-09-01 DIAGNOSIS — J441 Chronic obstructive pulmonary disease with (acute) exacerbation: Secondary | ICD-10-CM

## 2012-09-01 DIAGNOSIS — F411 Generalized anxiety disorder: Secondary | ICD-10-CM

## 2012-09-01 DIAGNOSIS — E41 Nutritional marasmus: Secondary | ICD-10-CM

## 2012-09-01 DIAGNOSIS — E43 Unspecified severe protein-calorie malnutrition: Secondary | ICD-10-CM | POA: Insufficient documentation

## 2012-09-01 LAB — COMPREHENSIVE METABOLIC PANEL
Albumin: 2.6 g/dL — ABNORMAL LOW (ref 3.5–5.2)
Alkaline Phosphatase: 81 U/L (ref 39–117)
BUN: 9 mg/dL (ref 6–23)
Calcium: 9.6 mg/dL (ref 8.4–10.5)
Creatinine, Ser: 0.54 mg/dL (ref 0.50–1.10)
GFR calc Af Amer: >90 mL/min (ref >90)
Glucose, Bld: 92 mg/dL (ref 70–99)
Potassium: 3.7 mEq/L (ref 3.5–5.1)
Total Protein: 5.8 g/dL — ABNORMAL LOW (ref 6.0–8.3)

## 2012-09-01 LAB — CBC WITH DIFFERENTIAL/PLATELET
Basophils Relative: 0 % (ref 0–1)
Eosinophils Absolute: 0 10*3/uL (ref 0.0–0.7)
Eosinophils Relative: 1 % (ref 0–5)
HCT: 42 % (ref 36.0–46.0)
Hemoglobin: 14 g/dL (ref 12.0–15.0)
Lymphs Abs: 0.6 10*3/uL — ABNORMAL LOW (ref 0.7–4.0)
MCH: 29.2 pg (ref 26.0–34.0)
MCHC: 33.3 g/dL (ref 30.0–36.0)
MCV: 87.5 fL (ref 78.0–100.0)
Monocytes Absolute: 0.4 10*3/uL (ref 0.1–1.0)
Monocytes Relative: 9 % (ref 3–12)
RBC: 4.8 MIL/uL (ref 3.87–5.11)

## 2012-09-01 LAB — STREP PNEUMONIAE URINARY ANTIGEN: Strep Pneumo Urinary Antigen: NEGATIVE

## 2012-09-01 LAB — TSH: TSH: 0.984 u[IU]/mL (ref 0.350–4.500)

## 2012-09-01 LAB — HIV ANTIBODY (ROUTINE TESTING W REFLEX): HIV: NONREACTIVE

## 2012-09-01 MED ORDER — ENSURE COMPLETE PO LIQD: 237.0000 mL | Freq: Every day | ORAL | Status: DC

## 2012-09-01 MED ORDER — ALBUTEROL SULFATE (5 MG/ML) 0.5% IN NEBU
2.5000 mg | INHALATION_SOLUTION | Freq: Four times a day (QID) | RESPIRATORY_TRACT | Status: DC | PRN
Start: 2012-09-01 — End: 2012-09-03

## 2012-09-01 MED ORDER — SODIUM CHLORIDE 0.9 % IV SOLN
INTRAVENOUS | Status: DC
  Administered 2012-09-01: 16:00:00 via INTRAVENOUS

## 2012-09-01 MED ORDER — ENOXAPARIN SODIUM 30 MG/0.3ML ~~LOC~~ SOLN
15.0000 mg | SUBCUTANEOUS | Status: DC
  Administered 2012-09-02: 15 mg via SUBCUTANEOUS
  Filled 2012-09-01 (×3): qty 0.15

## 2012-09-01 NOTE — Progress Notes (Signed)
INITIAL NUTRITION ASSESSMENT  DOCUMENTATION CODES Per approved criteria  -Severe malnutrition in the context of chronic illness -Underweight   INTERVENTION:  Vanilla Ensure Complete daily (350 kcals, 13 gm protein per 8 fl oz bottle) RD to follow for nutrition care plan  NUTRITION DIAGNOSIS: Underweight related to inadequate energy intake as evidenced by BMI of 11.9 kg/m2  Goal: Oral intake with meals & supplements to meet >/= 90% of estimated nutrition needs  Monitor:  PO & supplemental intake, weight, labs, I/O's  Reason for Assessment: Consult, Malnutrition Screening Tool Report  67 y.o. female  Admitting Dx: Pneumonia  ASSESSMENT: Patient with PMH of spinal fusion & neck fusion with residual lower extremity weakness, osteopenia, chronic pain, anxiety, arthritis, COPD and anorexia; presented to Dr. Marjory Lies approx 2 weeks ago with increased fatigue and chills ---> CXR yesterday showed left sided PNA.  Patient reports she's eating well and that the food here is "delicious;" reports weight stability and that she's been very small for several years; per husband, patient mostly consumes tea throughout the day and barely eats; she has visible muscle wasting & fat loss to upper body ---> amenable to [employer].  Patient meets criteria for severe malnutrition in the context of chronic illness as evidenced by < 75% intake of estimated energy requirement for > 1 month and severe muscle loss & subcutaneous fat loss.  Height: Ht Readings from Last 1 Encounters:  08/31/12 5' (1.524 m)    Weight: Wt Readings from Last 1 Encounters:  08/31/12 61 lb 4.8 oz (27.805 kg)    Ideal Body Weight: 100 lb  % Ideal Body Weight: 61%  Wt Readings from Last 10 Encounters:  08/31/12 61 lb 4.8 oz (27.805 kg)  06/01/12 67 lb 12.8 oz (30.754 kg)  03/07/12 64 lb (29.03 kg)  08/24/11 64 lb (29.03 kg)    Usual Body Weight: 64 lb  % Usual Body Weight: 95%  BMI:  Body mass index is  11.97 kg/(m^2).  Estimated Nutritional Needs: Kcal: 1000-1200 Protein: 50-60 gm Fluid: >/= 1.5 L  Skin: Intact  Diet Order: General  EDUCATION NEEDS: -No education needs identified at this time   Intake/Output Summary (Last 24 hours) at 09/01/12 1110 Last data filed at 08/31/12 1900  Gross per 24 hour  Intake 108.33 ml  Output      0 ml  Net 108.33 ml    Labs:   Recent Labs Lab 08/31/12 1822 09/01/12 0555  NA 131* 135  K 3.5 3.7  CL 97 100  CO2 27 28  BUN 9 9  CREATININE 0.46* 0.54  CALCIUM 9.8 9.6  MG 1.9  --   PHOS 2.7  --   GLUCOSE 86 92    Scheduled Meds: . albuterol  2.5 mg Nebulization QID   And  . ipratropium  0.5 mg Nebulization QID  . calcium-vitamin D   Oral Daily  . cholecalciferol  1,000 Units Oral Daily  . clorazepate  3.75 mg Oral TID  . dextromethorphan-guaiFENesin  1 tablet Oral BID  . enoxaparin (LOVENOX) injection  30 mg Subcutaneous Q24H  . levofloxacin (LEVAQUIN) IV  250 mg Intravenous Q24H  . predniSONE  50 mg Oral Q breakfast    Continuous Infusions: . sodium chloride 50 mL/hr at 08/31/12 1850    Past Medical History  Diagnosis Date  . Hypertension   . Hyponatremia   . Ascending aortic aneurysm 08/23/2011  . COPD (chronic obstructive pulmonary disease) 08/23/2011  . Cervical spondylosis with myelopathy   .  Occipital neuralgia   . Arthritis   . Chronic kidney disease     urine incontinence    Past Surgical History  Procedure Laterality Date  . Back surgery      multiple  . Spinal fusion      cervical fusion  . Spinal fusion  2003    Maureen Chatters, RD, LDN Pager #: [phone] After-Hours Pager #: [phone]

## 2012-09-01 NOTE — Progress Notes (Signed)
Utilization review completed. Shonnie Poudrier, RN, BSN. 

## 2012-09-01 NOTE — Progress Notes (Signed)
Patient got up to [facility]. Patient's buttocks is excoriated with scabbed areas. Patient states that she came in with this area on buttocks. Patient states that since her back surgery about 3 years ago her buttocks gets like this. Patient states that it will heal up for a little while then it will get like this again. Patient states that she puts her personal baby powders on the area. Will continue to monitor patient. Nelda Marseille, RN

## 2012-09-01 NOTE — Progress Notes (Signed)
PATIENT DETAILS Name: Regina [name] Age: 67 y.o. Sex: female Date of Birth: [birth year] Admit Date: 08/31/2012 Admitting Physician Dewayne Shorter Levora Dredge, MD ZOX:[id number] A, MD  Subjective: 67 yo female with a history of COPD, anorexia, anxiety, arthritis, HTN and chronic pain (multiple back surgeries) complaining of fatigue and weakness x 2 weeks.  Was put on Cefdinir x 1 week (Dr. Doristine Counter), without improvement.  She also complains of a productive cough.  Patient states today (7/18) she has had improvement of symptoms.  She denies sob and pain.  Assessment/Plan: Pneumonia - Continue Levofloxacin, day 2/7 - CXR 7/18 shows LLL infiltrate consistent with PNA -remains afebrile, and with no leukocytosis-clinically seems to have improved somewhat  COPD with exacerbation - Continue DuoNeb 4 times daily and presdnisone -still wheezing, but not in any acute distress  HTN -BP controlled without the use for anti-hypertensives  Malnutrition - Referral for nutrition consult, patient weighs 65 lbs - Have drawn malnutrition labs  Anorexia - Consider starting patient on Megace following nutrition consult.  Chronic pain syndrome - Continue home med, Tylenol #3  Anxiety - Continue home med, clorazepate  Disposition: -Remain inpatient  DVT Prophylaxis: Prophylactic Lovenox  Code Status: Full code  Family Communication Patients husband in room with patient  Procedures:  None  CONSULTS:  Nutrition   MEDICATIONS: Scheduled Meds: . albuterol  2.5 mg Nebulization QID   And  . ipratropium  0.5 mg Nebulization QID  . calcium-vitamin D   Oral Daily  . cholecalciferol  1,000 Units Oral Daily  . clorazepate  3.75 mg Oral TID  . dextromethorphan-guaiFENesin  1 tablet Oral BID  . enoxaparin (LOVENOX) injection  30 mg Subcutaneous Q24H  . levofloxacin (LEVAQUIN) IV  250 mg Intravenous Q24H  . predniSONE  50 mg Oral Q breakfast   Continuous Infusions: . sodium chloride 50 mL/hr at  08/31/12 1850   PRN Meds:.acetaminophen-codeine, aspirin-acetaminophen-caffeine  Antibiotics: Anti-infectives   Start     Dose/Rate Route Frequency Ordered Stop   09/01/12 1800  Levofloxacin (LEVAQUIN) IVPB 250 mg  Status:  Discontinued     250 mg 50 mL/hr over 60 Minutes Intravenous Every 24 hours 08/31/12 1731 08/31/12 2016   09/01/12 1800  Levofloxacin (LEVAQUIN) IVPB 250 mg     250 mg 50 mL/hr over 60 Minutes Intravenous Every 24 hours 08/31/12 2017 09/05/12 1759   08/31/12 2030  Levofloxacin (LEVAQUIN) IVPB 250 mg  Status:  Discontinued     250 mg 50 mL/hr over 60 Minutes Intravenous Every 48 hours 08/31/12 2016 08/31/12 2017   08/31/12 1815  levofloxacin (LEVAQUIN) IVPB 500 mg     500 mg 100 mL/hr over 60 Minutes Intravenous  Once 08/31/12 1731 08/31/12 1949   08/31/12 1730  levofloxacin (LEVAQUIN) IVPB 750 mg  Status:  Discontinued     750 mg 100 mL/hr over 90 Minutes Intravenous Every 24 hours 08/31/12 1715 08/31/12 1730       PHYSICAL EXAM: Vital signs in last 24 hours: Filed Vitals:   08/31/12 1536 08/31/12 2030 08/31/12 2110 09/01/12 0543  BP: 126/89 109/74  129/84  Pulse: 75 70  67  Temp: 98.5 F (36.9 C) 97.9 F (36.6 C)  98.4 F (36.9 C)  TempSrc: Oral Oral  Oral  Resp: 20 20  18   Height: 5' (1.524 m)     Weight: 27.805 kg (61 lb 4.8 oz)     SpO2: 96% 97% 97% 98%    Weight change:  Filed Weights   08/31/12 1536  Weight:  27.805 kg (61 lb 4.8 oz)   Body mass index is 11.97 kg/(m^2).   Gen Exam: Awake and alert with clear speech. NAD. Very thin female. Neck: No JVD. Chest: Poor air movement bibasilar, positive expiratory wheezing diffuse left upper lobe rhonchi CVS: S1 S2 Regular. No m/r/g. Abdomen: soft, BS +, non tender, non distended. Extremities: no edema, lower extremities warm to touch. Neurologic: Non Focal. Skin: No Rash. Wounds: N/A.  Intake/Output from previous day:  Intake/Output Summary (Last 24 hours) at 09/01/12 1047 Last data  filed at 08/31/12 1900  Gross per 24 hour  Intake 108.33 ml  Output      0 ml  Net 108.33 ml     LAB RESULTS: CBC  Recent Labs Lab 08/31/12 1822 09/01/12 0555  WBC 8.8 4.6  HGB 14.3 14.0  HCT 42.4 42.0  PLT 218 202  MCV 87.1 87.5  MCH 29.4 29.2  MCHC 33.7 33.3  RDW 13.8 13.8  LYMPHSABS 0.6* 0.6*  MONOABS 0.6 0.4  EOSABS 0.0 0.0  BASOSABS 0.0 0.0    Chemistries   Recent Labs Lab 08/31/12 1822 09/01/12 0555  NA 131* 135  K 3.5 3.7  CL 97 100  CO2 27 28  GLUCOSE 86 92  BUN 9 9  CREATININE 0.46* 0.54  CALCIUM 9.8 9.6  MG 1.9  --     GFR Estimated Creatinine Clearance: 29.9 ml/min (by C-G formula based on Cr of 0.54).   Recent Labs  08/31/12 1822  TSH 0.984     RADIOLOGY STUDIES/RESULTS: Dg Chest 2 View  09/01/2012   *RADIOLOGY REPORT*  Clinical Data: Pneumonia, history hypertension, COPD, smoking  CHEST - 2 VIEW  Comparison: 08/31/2012  Findings: Normal heart size and pulmonary vascularity. Calcified tortuous aorta. Severe emphysematous changes with persistent left basilar infiltrate consistent with pneumonia. Remaining lungs clear. No pleural effusion or pneumothorax. Marked osseous demineralization. Prior cervical spine fusion.  IMPRESSION: Severe COPD changes with left basilar infiltrate consistent with pneumonia. When compared to previous exam, little interval change.   Original Report Authenticated By: Ulyses Southward, M.D.    Rema Fendt, PA-S [facility]   If 7PM-7AM, please contact night-coverage [email or web address] Password [facility] 09/01/2012, 10:47 AM   LOS: 1 day   Attending Patient seen and examined, agree with the assessment and plan as outlined above. She is clinically improved, continue with above outlined plan.  S Ghimire

## 2012-09-01 NOTE — Evaluation (Addendum)
Physical Therapy Evaluation Patient Details Name: Regina [name] MRN: [id number] DOB: 1945/10/02 Today's Date: 09/01/2012 Time: 3086-5784 PT Time Calculation (min): 13 min  PT Assessment / Plan / Recommendation History of Present Illness  Pt adm with PNA.  Clinical Impression  Pt admitted with above. Pt currently with functional limitations due to the deficits listed below (see PT Problem List).  Pt will benefit from skilled PT to increase their independence and safety with mobility to allow discharge to the venue listed below.  Encouraged pt to amb with husband in the hallways.     PT Assessment  Patient needs continued PT services    Follow Up Recommendations  No PT follow up;Supervision - Intermittent    Does the patient have the potential to tolerate intense rehabilitation      Barriers to Discharge        Equipment Recommendations  None recommended by PT    Recommendations for Other Services     Frequency Min 3X/week    Precautions / Restrictions Precautions Precautions: Fall   Pertinent Vitals/Pain SaO2 97% on RA with amb      Mobility  Bed Mobility Bed Mobility: Supine to Sit;Sitting - Scoot to Edge of Bed Supine to Sit: 5: Supervision;With rails;HOB elevated Sitting - Scoot to Edge of Bed: 5: Supervision Transfers Transfers: Sit to Stand;Stand to Sit Sit to Stand: 4: Min guard;With upper extremity assist;From bed Stand to Sit: 4: Min guard;With upper extremity assist;With armrests;To chair/3-in-1 Ambulation/Gait Ambulation/Gait Assistance: 4: Min guard Ambulation Distance (Feet): 200 Feet Assistive device: None;Other (Comment) (pushing IV pole) Ambulation/Gait Assistance Details: slightly unsteady but no loss of balance. Gait Pattern: Step-through pattern;Decreased step length - left;Decreased step length - right Gait velocity: decr    Exercises     PT Diagnosis: Generalized weakness;Difficulty walking  PT Problem List: Decreased strength;Decreased  mobility;Decreased balance PT Treatment Interventions: DME instruction;Gait training;Patient/family education;Functional mobility training;Therapeutic activities;Therapeutic exercise;Balance training     PT Goals(Current goals can be found in the care plan section) Acute Rehab PT Goals Patient Stated Goal: return home PT Goal Formulation: With patient Time For Goal Achievement: 09/08/12 Potential to Achieve Goals: Good  Visit Information  Last PT Received On: 09/01/12 Assistance Needed: +1 History of Present Illness: Pt adm with PNA.       Prior Functioning  Home Living Family/patient expects to be discharged to:: Private residence Living Arrangements: Spouse/significant other Available Help at Discharge: Family;Available 24 hours/day Type of Home: House Home Access: Stairs to enter [employer] of Steps: 4 Entrance Stairs-Rails: Right Home Equipment: Wheelchair - manual Prior Function Level of Independence: Independent Communication Communication: No difficulties    Cognition  Cognition Arousal/Alertness: Awake/alert Behavior During Therapy: WFL for tasks assessed/performed Overall Cognitive Status: Within Functional Limits for tasks assessed    Extremity/Trunk Assessment Lower Extremity Assessment Lower Extremity Assessment: Generalized weakness   Balance Balance Balance Assessed: Yes Static Standing Balance Static Standing - Balance Support: No upper extremity supported Static Standing - Level of Assistance: 5: Stand by assistance  End of Session PT - End of Session Equipment Utilized During Treatment: Gait belt Activity Tolerance: Patient tolerated treatment well Patient left: in chair;with call bell/phone within reach;with family/visitor present  GP     [facility] 09/01/2012, 11:46 AM  Reznor Ferrando PT [phone]

## 2012-09-01 NOTE — Care Management Note (Signed)
    Page 1 of 1   09/04/2012     11:22:43 AM   CARE MANAGEMENT NOTE 09/04/2012  Patient:  Regina [name], Regina [name]   Account Number:  [id number]  Date Initiated:  09/01/2012  Documentation initiated by:  Letha Cape  Subjective/Objective Assessment:   dx pna  admit- lives with spouse.     Action/Plan:   pt eval- recs no pt follow up.   Anticipated DC Date:  09/03/2012   Anticipated DC Plan:  HOME/SELF CARE      DC Planning Services  CM consult      Choice offered to / List presented to:             Status of service:  Completed, signed off Medicare Important Message given?   (If response is "NO", the following Medicare IM given date fields will be blank) Date Medicare IM given:   Date Additional Medicare IM given:    Discharge Disposition:  HOME/SELF CARE  Per UR Regulation:  Reviewed for med. necessity/level of care/duration of stay  If discussed at Long Length of Stay Meetings, dates discussed:    Comments:  09/04/12 11:21 Letha Cape RN, BSN [phone] patient dc to home, no needs anticipated.  09/01/12 14:47 Letha Cape RN, BSN [phone] patient lives with spouse, per physical therapy recs no pt follow up.  NCM will continue to follow for dc needs.

## 2012-09-02 DIAGNOSIS — F5000 Anorexia nervosa, unspecified: Secondary | ICD-10-CM

## 2012-09-02 LAB — CBC WITH DIFFERENTIAL/PLATELET
Basophils Absolute: 0 10*3/uL (ref 0.0–0.1)
Eosinophils Relative: 0 % (ref 0–5)
Lymphocytes Relative: 12 % (ref 12–46)
MCV: 88.5 fL (ref 78.0–100.0)
Neutro Abs: 6.2 10*3/uL (ref 1.7–7.7)
Neutrophils Relative %: 78 % — ABNORMAL HIGH (ref 43–77)
Platelets: 209 10*3/uL (ref 150–400)
RDW: 13.8 % (ref 11.5–15.5)
WBC: 8 10*3/uL (ref 4.0–10.5)

## 2012-09-02 LAB — EXPECTORATED SPUTUM ASSESSMENT W GRAM STAIN, RFLX TO RESP C

## 2012-09-02 NOTE — Progress Notes (Addendum)
PATIENT DETAILS Name: Regina [name] Age: 67 y.o. Sex: female Date of Birth: Jul 03, 1945 Admit Date: 08/31/2012 Admitting Physician Dewayne Shorter Levora Dredge, MD RUE:[id number],XBJYN A, MD  Subjective: Feels a lot better-ambulated in the hallway with no major issues-eating properly  Assessment/Plan: PNA Continue Levofloxacin, day 3/7  - CXR 7/18 shows LLL infiltrate consistent with PNA  -remains afebrile, and with no leukocytosis-clinically improved significantly -blood culture-7/14-neg so far  COPD with exacerbation  - Continue DuoNeb 4 times daily -lungs clear today  HTN  -BP controlled without the use for anti-hypertensives   Malnutrition  - Referral for nutrition consult, patient weighs 65 lbs   Anorexia  - nutrition consult.   Chronic pain syndrome  - Continue home med, Tylenol #3   Anxiety  - Continue home med, clorazepate   Disposition: Remain inpatient-home in am  DVT Prophylaxis: Prophylactic Lovenox   Code Status: Full code   Family Communication Husband at bedside  Procedures:  None  CONSULTS:  None   MEDICATIONS: Scheduled Meds: . calcium-vitamin D   Oral Daily  . cholecalciferol  1,000 Units Oral Daily  . clorazepate  3.75 mg Oral TID  . dextromethorphan-guaiFENesin  1 tablet Oral BID  . enoxaparin (LOVENOX) injection  15 mg Subcutaneous Q24H  . feeding supplement  237 mL Oral Q1500  . levofloxacin (LEVAQUIN) IV  250 mg Intravenous Q24H   Continuous Infusions: . sodium chloride 50 mL/hr at 09/01/12 1600   PRN Meds:.acetaminophen-codeine, albuterol, aspirin-acetaminophen-caffeine  Antibiotics: Anti-infectives   Start     Dose/Rate Route Frequency Ordered Stop   09/01/12 1800  Levofloxacin (LEVAQUIN) IVPB 250 mg  Status:  Discontinued     250 mg 50 mL/hr over 60 Minutes Intravenous Every 24 hours 08/31/12 1731 08/31/12 2016   09/01/12 1800  Levofloxacin (LEVAQUIN) IVPB 250 mg     250 mg 50 mL/hr over 60 Minutes Intravenous Every 24 hours  08/31/12 2017 09/05/12 1759   08/31/12 2030  Levofloxacin (LEVAQUIN) IVPB 250 mg  Status:  Discontinued     250 mg 50 mL/hr over 60 Minutes Intravenous Every 48 hours 08/31/12 2016 08/31/12 2017   08/31/12 1815  levofloxacin (LEVAQUIN) IVPB 500 mg     500 mg 100 mL/hr over 60 Minutes Intravenous  Once 08/31/12 1731 08/31/12 1949   08/31/12 1730  levofloxacin (LEVAQUIN) IVPB 750 mg  Status:  Discontinued     750 mg 100 mL/hr over 90 Minutes Intravenous Every 24 hours 08/31/12 1715 08/31/12 1730       PHYSICAL EXAM: Vital signs in last 24 hours: Filed Vitals:   09/01/12 1445 09/01/12 1500 09/01/12 2105 09/02/12 0522  BP: 128/74 126/68 106/68 133/82  Pulse: 70 72 75 71  Temp: 98.6 F (37 C) 97.8 F (36.6 C) 98.6 F (37 C) 98 F (36.7 C)  TempSrc: Oral Oral Oral Oral  Resp: 18 18 16 16   Height:      Weight:      SpO2: 98% 97% 96% 97%    Weight change:  Filed Weights   08/31/12 1536  Weight: 27.805 kg (61 lb 4.8 oz)   Body mass index is 11.97 kg/(m^2).   Gen Exam: Awake and alert with clear speech.   Neck: Supple, No JVD.   Chest: B/L Clear.   CVS: S1 S2 Regular, no murmurs.  Abdomen: soft, BS +, non tender, non distended.  Extremities: no edema, lower extremities warm to touch Neurologic: Non Focal.   Skin: No Rash.   Wounds: N/A.    Intake/Output  from previous day:  Intake/Output Summary (Last 24 hours) at 09/02/12 1341 Last data filed at 09/02/12 0604  Gross per 24 hour  Intake 883.33 ml  Output    400 ml  Net 483.33 ml     LAB RESULTS: CBC  Recent Labs Lab 08/31/12 1822 09/01/12 0555 09/02/12 0555  WBC 8.8 4.6 8.0  HGB 14.3 14.0 13.6  HCT 42.4 42.0 40.1  PLT 218 202 209  MCV 87.1 87.5 88.5  MCH 29.4 29.2 30.0  MCHC 33.7 33.3 33.9  RDW 13.8 13.8 13.8  LYMPHSABS 0.6* 0.6* 1.0  MONOABS 0.6 0.4 0.8  EOSABS 0.0 0.0 0.0  BASOSABS 0.0 0.0 0.0    Chemistries   Recent Labs Lab 08/31/12 1822 09/01/12 0555  NA 131* 135  K 3.5 3.7  CL 97  100  CO2 27 28  GLUCOSE 86 92  BUN 9 9  CREATININE 0.46* 0.54  CALCIUM 9.8 9.6  MG 1.9  --     CBG: No results found for this basename: GLUCAP,  in the last 168 hours  GFR Estimated Creatinine Clearance: 29.9 ml/min (by C-G formula based on Cr of 0.54).  Coagulation profile No results found for this basename: INR, PROTIME,  in the last 168 hours  Cardiac Enzymes No results found for this basename: CK, CKMB, TROPONINI, MYOGLOBIN,  in the last 168 hours  No components found with this basename: POCBNP,  No results found for this basename: DDIMER,  in the last 72 hours No results found for this basename: HGBA1C,  in the last 72 hours No results found for this basename: CHOL, HDL, LDLCALC, TRIG, CHOLHDL, LDLDIRECT,  in the last 72 hours  Recent Labs  08/31/12 1822  TSH 0.984   No results found for this basename: VITAMINB12, FOLATE, FERRITIN, TIBC, IRON, RETICCTPCT,  in the last 72 hours No results found for this basename: LIPASE, AMYLASE,  in the last 72 hours  Urine Studies No results found for this basename: UACOL, UAPR, USPG, UPH, UTP, UGL, UKET, UBIL, UHGB, UNIT, UROB, ULEU, UEPI, UWBC, URBC, UBAC, CAST, CRYS, UCOM, BILUA,  in the last 72 hours  MICROBIOLOGY: Recent Results (from the past 240 hour(s))  CULTURE, BLOOD (ROUTINE X 2)     Status: None   Collection Time    08/31/12  6:15 PM      Result Value Range Status   Specimen Description BLOOD RIGHT ANTECUBITAL   Final   Special Requests BOTTLES DRAWN AEROBIC AND ANAEROBIC 10CC   Final   Culture  Setup Time 09/01/2012 02:01   Final   Culture     Final   Value:        BLOOD CULTURE RECEIVED NO GROWTH TO DATE CULTURE WILL BE HELD FOR 5 DAYS BEFORE ISSUING A FINAL NEGATIVE REPORT   Report Status PENDING   Incomplete  CULTURE, BLOOD (ROUTINE X 2)     Status: None   Collection Time    08/31/12  6:30 PM      Result Value Range Status   Specimen Description BLOOD ARM RIGHT   Final   Special Requests BOTTLES DRAWN  AEROBIC AND ANAEROBIC 10CC   Final   Culture  Setup Time 09/01/2012 02:01   Final   Culture     Final   Value:        BLOOD CULTURE RECEIVED NO GROWTH TO DATE CULTURE WILL BE HELD FOR 5 DAYS BEFORE ISSUING A FINAL NEGATIVE REPORT   Report Status PENDING   Incomplete  CULTURE, EXPECTORATED SPUTUM-ASSESSMENT     Status: None   Collection Time    09/02/12  7:26 AM      Result Value Range Status   Specimen Description SPUTUM   Final   Special Requests NONE   Final   Sputum evaluation     Final   Value: THIS SPECIMEN IS ACCEPTABLE. RESPIRATORY CULTURE REPORT TO FOLLOW.   Report Status 09/02/2012 FINAL   Final    RADIOLOGY STUDIES/RESULTS: Dg Chest 2 View  09/01/2012   *RADIOLOGY REPORT*  Clinical Data: Pneumonia, history hypertension, COPD, smoking  CHEST - 2 VIEW  Comparison: 08/31/2012  Findings: Normal heart size and pulmonary vascularity. Calcified tortuous aorta. Severe emphysematous changes with persistent left basilar infiltrate consistent with pneumonia. Remaining lungs clear. No pleural effusion or pneumothorax. Marked osseous demineralization. Prior cervical spine fusion.  IMPRESSION: Severe COPD changes with left basilar infiltrate consistent with pneumonia. When compared to previous exam, little interval change.   Original Report Authenticated By: Ulyses Southward, M.D.    Jeoffrey Massed, MD  [facility] Pager:[phone]  If 7PM-7AM, please contact night-coverage [email or web address] Password [email or web address] 09/02/2012, 1:41 PM   LOS: 2 days

## 2012-09-03 DIAGNOSIS — I10 Essential (primary) hypertension: Secondary | ICD-10-CM

## 2012-09-03 DIAGNOSIS — E43 Unspecified severe protein-calorie malnutrition: Secondary | ICD-10-CM

## 2012-09-03 LAB — LEGIONELLA ANTIGEN, URINE

## 2012-09-03 MED ORDER — DM-GUAIFENESIN ER 30-600 MG PO TB12: 1.0000 | ORAL_TABLET | Freq: Two times a day (BID) | ORAL | Status: DC

## 2012-09-03 MED ORDER — LEVOFLOXACIN 250 MG PO TABS
250.0000 mg | ORAL_TABLET | Freq: Every day | ORAL | Status: DC
  Filled 2012-09-03: qty 1

## 2012-09-03 MED ORDER — ENSURE COMPLETE PO LIQD: 237.0000 mL | Freq: Every day | ORAL | Status: DC

## 2012-09-03 MED ORDER — LEVOFLOXACIN 250 MG PO TABS: 250.0000 mg | ORAL_TABLET | Freq: Every day | ORAL | Status: DC

## 2012-09-03 NOTE — Progress Notes (Signed)
Regina [name] to be D/C'd Home per MD order.  Discussed with the patient and all questions fully answered.    Medication List    STOP taking these medications       cefdinir 300 MG capsule  Commonly known as:  OMNICEF     multivitamin with minerals Tabs      TAKE these medications       acetaminophen-codeine 300-30 MG per tablet  Commonly known as:  TYLENOL #3  Take 1 tablet by mouth every 6 (six) hours as needed for pain.     aspirin-acetaminophen-caffeine 250-250-65 MG per tablet  Commonly known as:  EXCEDRIN MIGRAINE  Take 1 tablet by mouth every 8 (eight) hours as needed for pain.     CALTRATE 600+D PO  Take 1 tablet by mouth.     cholecalciferol 1000 UNITS tablet  Commonly known as:  VITAMIN D  Take 1,000 Units by mouth daily.     clorazepate 3.75 MG tablet  Commonly known as:  TRANXENE  Take 3.75 mg by mouth 3 (three) times daily.     dextromethorphan-guaiFENesin 30-600 MG per 12 hr tablet  Commonly known as:  MUCINEX DM  Take 1 tablet by mouth 2 (two) times daily.     feeding supplement Liqd  Take 237 mLs by mouth daily at 3 pm.     levofloxacin 250 MG tablet  Commonly known as:  LEVAQUIN  Take 1 tablet (250 mg total) by mouth daily.        VVS, Skin clean, dry and intact without evidence of skin break down, no evidence of skin tears noted. IV catheter discontinued intact. Site without signs and symptoms of complications. Dressing and pressure applied.  An After Visit Summary was printed and given to the patient. Follow up appointments , new prescriptions and medication administration times given. Handout on pneumonia given to pt and discussed  Patient escorted via WC, and D/C home via private auto.  Cindra Eves, RN 09/03/2012 11:27 AM

## 2012-09-03 NOTE — Discharge Summary (Signed)
PATIENT DETAILS Name: Regina [name] Age: 67 y.o. Sex: female Date of Birth: 02/12/1946 MRN: [id number]. Admit Date: 08/31/2012 Admitting Physician: Maretta Bees, MD WUJ:[id number],WGNFA A, MD  Recommendations for Outpatient Follow-up:  1. Please repeat Chest Xray in 6-8 weeks to document resolution of PNA, if no resolution-please refer to Pulmonology for further work up 2. Needs outpatient close nutrition follow up as well.  PRIMARY DISCHARGE DIAGNOSIS:  Principal Problem:   Pneumonia Active Problems:   Ascending aortic aneurysm   Hypertension   COPD (chronic obstructive pulmonary disease)   Chronic pain syndrome   Anxiety state, unspecified   Tobacco dependence   Anorexia nervosa   Severe malnutrition   Protein-calorie malnutrition, severe      PAST MEDICAL HISTORY: Past Medical History  Diagnosis Date  . Hypertension   . Hyponatremia   . Ascending aortic aneurysm 08/23/2011  . COPD (chronic obstructive pulmonary disease) 08/23/2011  . Cervical spondylosis with myelopathy   . Occipital neuralgia   . Arthritis   . Chronic kidney disease     urine incontinence    DISCHARGE MEDICATIONS:   Medication List    STOP taking these medications       cefdinir 300 MG capsule  Commonly known as:  OMNICEF     multivitamin with minerals Tabs      TAKE these medications       acetaminophen-codeine 300-30 MG per tablet  Commonly known as:  TYLENOL #3  Take 1 tablet by mouth every 6 (six) hours as needed for pain.     aspirin-acetaminophen-caffeine 250-250-65 MG per tablet  Commonly known as:  EXCEDRIN MIGRAINE  Take 1 tablet by mouth every 8 (eight) hours as needed for pain.     CALTRATE 600+D PO  Take 1 tablet by mouth.     cholecalciferol 1000 UNITS tablet  Commonly known as:  VITAMIN D  Take 1,000 Units by mouth daily.     clorazepate 3.75 MG tablet  Commonly known as:  TRANXENE  Take 3.75 mg by mouth 3 (three) times daily.     dextromethorphan-guaiFENesin  30-600 MG per 12 hr tablet  Commonly known as:  MUCINEX DM  Take 1 tablet by mouth 2 (two) times daily.     feeding supplement Liqd  Take 237 mLs by mouth daily at 3 pm.     levofloxacin 250 MG tablet  Commonly known as:  LEVAQUIN  Take 1 tablet (250 mg total) by mouth daily.        ALLERGIES:   Allergies  Allergen Reactions  . Erythromycin Rash    BRIEF HPI:  See H&P, Labs, Consult and Test reports for all details in brief, Regina [name] is a 67 y.o. WF PMHx S/P Spinal fusion 2000/neck fusion 2003 with residual lower extremity weakness, Anxiety, Arthritis, COPD(on no Tx), Anorexia, Fatigue, HTN, Osteopenia, Chronic Pain (Multiple Back Surgeries). Presented to Dr Marjory Lies approx (2) wks ago w/ Inc fatigue, (+) chills. States started on antibiotics Cefdinir x1 week without improvement.She was then admitted to the hospitalist service for further evaluation and treatment.  CONSULTATIONS:   None  PERTINENT RADIOLOGIC STUDIES: Dg Chest 2 View  09/01/2012   *RADIOLOGY REPORT*  Clinical Data: Pneumonia, history hypertension, COPD, smoking  CHEST - 2 VIEW  Comparison: 08/31/2012  Findings: Normal heart size and pulmonary vascularity. Calcified tortuous aorta. Severe emphysematous changes with persistent left basilar infiltrate consistent with pneumonia. Remaining lungs clear. No pleural effusion or pneumothorax. Marked osseous demineralization. Prior cervical spine fusion.  IMPRESSION:  Severe COPD changes with left basilar infiltrate consistent with pneumonia. When compared to previous exam, little interval change.   Original Report Authenticated By: Ulyses Southward, M.D.     PERTINENT LAB RESULTS: CBC:  Recent Labs  09/01/12 0555 09/02/12 0555  WBC 4.6 8.0  HGB 14.0 13.6  HCT 42.0 40.1  PLT 202 209   CMET CMP     Component Value Date/Time   NA 135 09/01/2012 0555   K 3.7 09/01/2012 0555   CL 100 09/01/2012 0555   CO2 28 09/01/2012 0555   GLUCOSE 92 09/01/2012 0555   BUN 9  09/01/2012 0555   CREATININE 0.54 09/01/2012 0555   CREATININE 0.63 03/02/2012 1225   CALCIUM 9.6 09/01/2012 0555   PROT 5.8* 09/01/2012 0555   ALBUMIN 2.6* 09/01/2012 0555   AST 15 09/01/2012 0555   ALT 10 09/01/2012 0555   ALKPHOS 81 09/01/2012 0555   BILITOT 0.4 09/01/2012 0555   GFRNONAA >90 09/01/2012 0555   GFRAA >90 09/01/2012 0555    GFR Estimated Creatinine Clearance: 29.9 ml/min (by C-G formula based on Cr of 0.54). No results found for this basename: LIPASE, AMYLASE,  in the last 72 hours No results found for this basename: CKTOTAL, CKMB, CKMBINDEX, TROPONINI,  in the last 72 hours No components found with this basename: POCBNP,  No results found for this basename: DDIMER,  in the last 72 hours No results found for this basename: HGBA1C,  in the last 72 hours No results found for this basename: CHOL, HDL, LDLCALC, TRIG, CHOLHDL, LDLDIRECT,  in the last 72 hours  Recent Labs  08/31/12 1822  TSH 0.984   No results found for this basename: VITAMINB12, FOLATE, FERRITIN, TIBC, IRON, RETICCTPCT,  in the last 72 hours Coags: No results found for this basename: PT, INR,  in the last 72 hours Microbiology: Recent Results (from the past 240 hour(s))  CULTURE, BLOOD (ROUTINE X 2)     Status: None   Collection Time    08/31/12  6:15 PM      Result Value Range Status   Specimen Description BLOOD RIGHT ANTECUBITAL   Final   Special Requests BOTTLES DRAWN AEROBIC AND ANAEROBIC 10CC   Final   Culture  Setup Time 09/01/2012 02:01   Final   Culture     Final   Value:        BLOOD CULTURE RECEIVED NO GROWTH TO DATE CULTURE WILL BE HELD FOR 5 DAYS BEFORE ISSUING A FINAL NEGATIVE REPORT   Report Status PENDING   Incomplete  CULTURE, BLOOD (ROUTINE X 2)     Status: None   Collection Time    08/31/12  6:30 PM      Result Value Range Status   Specimen Description BLOOD ARM RIGHT   Final   Special Requests BOTTLES DRAWN AEROBIC AND ANAEROBIC 10CC   Final   Culture  Setup Time 09/01/2012 02:01    Final   Culture     Final   Value:        BLOOD CULTURE RECEIVED NO GROWTH TO DATE CULTURE WILL BE HELD FOR 5 DAYS BEFORE ISSUING A FINAL NEGATIVE REPORT   Report Status PENDING   Incomplete  CULTURE, EXPECTORATED SPUTUM-ASSESSMENT     Status: None   Collection Time    09/02/12  7:26 AM      Result Value Range Status   Specimen Description SPUTUM   Final   Special Requests NONE   Final   Sputum evaluation  Final   Value: THIS SPECIMEN IS ACCEPTABLE. RESPIRATORY CULTURE REPORT TO FOLLOW.   Report Status 09/02/2012 FINAL   Final     BRIEF HOSPITAL COURSE:   Principal Problem:   Pneumonia -Patient was admitted,  A CXR done-did confirm Left sided PNA. She was started on empiric Levaquin and Hydrated with IVF, with these measures she quickly improved, and by the day of discharge was able to ambulate in the room and hallway without significant issues, she is very close to her usual baseline. On discharge we will continue with 4 more days of Levaquin to complete a 7 day course.She was evaluated by PT who felt patient did not require further PT services. -She will require a follow up chest xray in 6-8 weeks to document resolution of the infiltrate.  Active Problems: COPD with mild exacerbation  -on admission, she had evidence of wheezing she was given steroids for a few days here, and was treated with nebulized bronchodilators.Her lungs are clear on auscultation at the time of discharge.  HTN  -BP controlled without the use for anti-hypertensives  -please monitor in the outpatient setting whether or not she needs to be started on anti-hypertensives  Severe Malnutrition  - Referral for nutrition consult, patient weighs 65 lbs  -supplements on discharge  TODAY-DAY OF DISCHARGE:  Subjective:   [facility] today has no headache,no chest abdominal pain,no new weakness tingling or numbness, feels much better wants to go home today.   Objective:   Blood pressure 143/89, pulse 60,  temperature 98.2 F (36.8 C), temperature source Oral, resp. rate 20, height 5' (1.524 m), weight 27.805 kg (61 lb 4.8 oz), SpO2 97.00%.  Intake/Output Summary (Last 24 hours) at 09/03/12 0902 Last data filed at 09/02/12 1814  Gross per 24 hour  Intake     50 ml  Output      0 ml  Net     50 ml   Filed Weights   08/31/12 1536  Weight: 27.805 kg (61 lb 4.8 oz)    Exam Awake Alert, Oriented *3, No new F.N deficits, Normal affect [place].AT,PERRAL Supple Neck,No JVD, No cervical lymphadenopathy appriciated.  Symmetrical Chest wall movement, Good air movement bilaterally, CTAB RRR,No Gallops,Rubs or new Murmurs, No Parasternal Heave +ve B.Sounds, Abd Soft, Non tender, No organomegaly appriciated, No rebound -guarding or rigidity. No Cyanosis, Clubbing or edema, No new Rash or bruise  DISCHARGE CONDITION: Stable  DISPOSITION: Home  DISCHARGE INSTRUCTIONS:    Activity:  As tolerated with Full fall precautions use walker/cane & assistance as needed  Diet recommendation: Regular Diet       Discharge Orders   Future Orders Complete By Expires     Call MD for:  persistant nausea and vomiting  As directed     Call MD for:  severe uncontrolled pain  As directed     Diet - low sodium heart healthy  As directed     Increase activity slowly  As directed        Follow-up Information   Follow up with BURNETT,BRENT A, MD. Schedule an appointment as soon as possible for a visit in 1 week.   Contact information:   [address] [place] [address] [phone]         Total Time spent on discharge equals 45 minutes.  SignedJeoffrey Massed 09/03/2012 9:02 AM

## 2012-09-05 LAB — CULTURE, RESPIRATORY W GRAM STAIN: Culture: NORMAL

## 2012-09-07 LAB — CULTURE, BLOOD (ROUTINE X 2): Culture: NO GROWTH

## 2012-09-20 ENCOUNTER — Other Ambulatory Visit: Payer: Self-pay

## 2012-12-21 ENCOUNTER — Other Ambulatory Visit: Payer: Self-pay

## 2013-02-16 ENCOUNTER — Other Ambulatory Visit: Payer: Self-pay | Admitting: *Deleted

## 2013-02-16 DIAGNOSIS — I712 Thoracic aortic aneurysm, without rupture, unspecified: Secondary | ICD-10-CM

## 2013-02-27 ENCOUNTER — Encounter: Payer: Medicare Other | Admitting: Thoracic Surgery (Cardiothoracic Vascular Surgery)

## 2013-03-06 ENCOUNTER — Ambulatory Visit (INDEPENDENT_AMBULATORY_CARE_PROVIDER_SITE_OTHER): Payer: Medicare Other | Admitting: Thoracic Surgery (Cardiothoracic Vascular Surgery)

## 2013-03-06 ENCOUNTER — Encounter: Payer: Self-pay | Admitting: Thoracic Surgery (Cardiothoracic Vascular Surgery)

## 2013-03-06 ENCOUNTER — Ambulatory Visit
Admission: RE | Admit: 2013-03-06 | Discharge: 2013-03-06 | Disposition: A | Discharge status: Home / Self Care | Payer: Medicare Other | Source: Ambulatory Visit | Attending: Surgery | Admitting: Surgery

## 2013-03-06 VITALS — BP 128/87 | HR 65 | Resp 16 | Ht 60.0 in | Wt <= 1120 oz

## 2013-03-06 DIAGNOSIS — I712 Thoracic aortic aneurysm, without rupture, unspecified: Secondary | ICD-10-CM

## 2013-03-06 MED ORDER — GADOBENATE DIMEGLUMINE 529 MG/ML IV SOLN
5.0000 mL | Freq: Once | INTRAVENOUS | Status: AC | PRN
  Administered 2013-03-06: 5 mL via INTRAVENOUS

## 2013-03-06 NOTE — Progress Notes (Signed)
HPI:  [name] returns today for a one-year followup of her ascending aortic aneurysm.  She is 68 year old woman with a history of hypertension and tobacco abuse who was incidentally noted to have a 4 cm ascending aortic aneurysm on an MR of the spine in July of 2013. I saw her then and then again in January of 2014. The ascending aorta measured 3.9-4 cm.  Since her last visit she had pneumonia in July. Other than that she says she's been well. She denies any chest pain or shortness of breath.  Past Medical History  Diagnosis Date  . Hypertension   . Hyponatremia   . Ascending aortic aneurysm 08/23/2011  . COPD (chronic obstructive pulmonary disease) 08/23/2011  . Cervical spondylosis with myelopathy   . Occipital neuralgia   . Arthritis   . Chronic kidney disease     urine incontinence   malnutrition    Current Outpatient Prescriptions  Medication Sig Dispense Refill  . acetaminophen-codeine (TYLENOL #3) 300-30 MG per tablet Take 1 tablet by mouth every 6 (six) hours as needed for pain.       Marland Kitchen aspirin-acetaminophen-caffeine (EXCEDRIN MIGRAINE) 250-250-65 MG per tablet Take 1 tablet by mouth every 8 (eight) hours as needed for pain.       . Calcium Carbonate-Vitamin D (CALTRATE 600+D PO) Take 1 tablet by mouth.      . cholecalciferol (VITAMIN D) 1000 UNITS tablet Take 1,000 Units by mouth daily.      . clorazepate (TRANXENE) 3.75 MG tablet Take 3.75 mg by mouth 3 (three) times daily.       Marland Kitchen dextromethorphan-guaiFENesin (MUCINEX DM) 30-600 MG per 12 hr tablet Take 1 tablet by mouth 2 (two) times daily.  10 tablet  0  . lactose free nutrition (BOOST) LIQD Take 237 mLs by mouth 3 (three) times daily between meals.       No current facility-administered medications for this visit.    Physical Exam BP 128/87  Pulse 65  Resp 16  Ht 5' (1.524 m)  Wt 64 lb (29.03 kg)  BMI 12.50 kg/m2  SpO56 65% 68 year old woman in no acute distress Cachectic with temporal and thenar  wasting Alert and oriented Cardiac regular rate and rhythm normal S1 and S2, 2/6 systolic murmur Lungs clear No carotid bruits  Diagnostic Tests: MR angiogram MRA CHEST WITH OR WITHOUT CONTRAST  TECHNIQUE:  Angiographic images of the chest were obtained using MRA technique  without and with intravenous contrast.  CONTRAST: 19mL MULTIHANCE GADOBENATE DIMEGLUMINE 529 MG/ML IV SOLN  BUN and creatinine were obtained on site at [facility] at  [address]  Results: BUN 4 mg/dL, Creatinine 0.6 mg/dL.  COMPARISON: Chest CT - 03/07/2012; 07/23/2011  FINDINGS:  Vascular Findings:  There is grossly unchanged fusiform ectasia of the ascending  thoracic aorta with measurements as follows. No definite evidence of  thoracic aortic dissection. No periaortic stranding or definite  intramural hematoma formation. The ascending thoracic aorta tapers  to a normal caliber at the level of the aortic arch. The descending  thoracic aorta is noted to be tortuous but of normal caliber.  The left vertebral artery is incidentally noted to arise directly  from the aortic arch. The branch vessels of the aortic arch widely  patent. Incidental note is made of a small ductus diverticulum.  Normal heart size. No pericardial effusion. Normal caliber the main  pulmonary artery. There are no discrete filling defects within the  sella central pulmonary arterial tree to  suggest central pulmonary  embolism.  -------------------------------------------------------------  Thoracic aortic measurements:  Aortic root:  36 mm in greatest oblique sagittal dimension.  Sinotubular junction  30 mm as measured in greatest oblique sagittal dimension.  Proximal ascending aorta  37 mm as measured in greatest oblique axial dimension at the level  of the main pulmonary artery an approximately 38 mm in greatest  oblique sagittal dimension (image 49, series 12).  Aortic arch aorta  24 mm as measured in greatest  oblique sagittal dimension.  Proximal descending thoracic aorta  23 mm as measured in greatest oblique axial dimension at the level  of the main pulmonary artery.  Distal descending thoracic aorta  18 mm as measured in greatest oblique sagittal dimension at the  level of the diaphragmatic hiatus.  Review of the MIP images confirms the above findings.  -------------------------------------------------------------  Non-Vascular Findings:  Incidental note is made of a trace left-sided pleural effusion.  Minimal heterogeneous opacities within the left lower lobe, possibly  atelectasis.  IMPRESSION:  1. Grossly unchanged fusiform ectasia of the ascending thoracic  aorta measuring approximately 38 mm in greatest oblique sagittal  dimension, previously, 40 mm, with slight differences possibly  attributable to differences in imaging modalities.  2. No definite evidence of thoracic aortic dissection or periaortic  stranding.  3. Trace left-sided pleural effusion.  4. Minimal left basilar opacities, incompletely evaluated, possibly  atelectasis or scar.  Electronically Signed  By: Sandi Mariscal M.D.  On: 03/06/2013 13:13  Impression: 68 year old woman with a known 4.0 cm ascending aortic aneurysm. This is unchanged over the past year. Her blood pressure is well-controlled. There is no indication for surgery at this time.   Plan: Return in one year with repeat MR angiogram chest

## 2014-02-19 ENCOUNTER — Other Ambulatory Visit: Payer: Self-pay

## 2014-02-19 DIAGNOSIS — I712 Thoracic aortic aneurysm, without rupture: Secondary | ICD-10-CM

## 2014-02-19 DIAGNOSIS — I7121 Aneurysm of the ascending aorta, without rupture: Secondary | ICD-10-CM

## 2014-03-15 ENCOUNTER — Other Ambulatory Visit: Payer: Self-pay | Admitting: *Deleted

## 2014-03-18 ENCOUNTER — Other Ambulatory Visit: Payer: Self-pay | Admitting: *Deleted

## 2014-03-18 DIAGNOSIS — I712 Thoracic aortic aneurysm, without rupture: Secondary | ICD-10-CM

## 2014-03-18 DIAGNOSIS — I7121 Aneurysm of the ascending aorta, without rupture: Secondary | ICD-10-CM

## 2014-03-18 LAB — CREATININE, SERUM: Creat: 0.5 mg/dL (ref 0.50–1.10)

## 2014-03-18 LAB — BUN: BUN: 9 mg/dL (ref 6–23)

## 2014-03-19 ENCOUNTER — Ambulatory Visit (INDEPENDENT_AMBULATORY_CARE_PROVIDER_SITE_OTHER): Payer: Medicare Other | Admitting: Thoracic Surgery (Cardiothoracic Vascular Surgery)

## 2014-03-19 ENCOUNTER — Encounter: Payer: Self-pay | Admitting: Thoracic Surgery (Cardiothoracic Vascular Surgery)

## 2014-03-19 ENCOUNTER — Ambulatory Visit
Admission: RE | Admit: 2014-03-19 | Discharge: 2014-03-19 | Disposition: A | Discharge status: Home / Self Care | Payer: Medicare Other | Source: Ambulatory Visit | Attending: Thoracic Surgery (Cardiothoracic Vascular Surgery) | Admitting: Thoracic Surgery (Cardiothoracic Vascular Surgery)

## 2014-03-19 VITALS — BP 123/88 | HR 72 | Resp 18 | Ht 60.0 in | Wt <= 1120 oz

## 2014-03-19 DIAGNOSIS — I712 Thoracic aortic aneurysm, without rupture: Secondary | ICD-10-CM

## 2014-03-19 DIAGNOSIS — I7121 Aneurysm of the ascending aorta, without rupture: Secondary | ICD-10-CM

## 2014-03-19 MED ORDER — GADOBENATE DIMEGLUMINE 529 MG/ML IV SOLN
5.0000 mL | Freq: Once | INTRAVENOUS | Status: AC | PRN
  Administered 2014-03-19: 5 mL via INTRAVENOUS

## 2014-03-19 NOTE — Progress Notes (Signed)
HPI:  Mrs. Cagley returns for a scheduled 1 year follow-up visit.  She is a 69 year old woman with multiple medical problems including cervical spondylosis with muscle wasting. We have followed her since 2013 for an ascending aortic aneurysm. This was first noted as an incidental finding on a CT of the spine. CT of the chest and showed the aneurysm was 4 cm. She had an echocardiogram back in 2011 which showed no evidence of valvular disease. She had no symptoms referable to the aneurysm and she's been followed since then. She was last seen in the office in January 2015.  She denies any chest pain, tightness, or shortness of breath. She denies any peripheral edema. She does not have orthopnea or paroxysmal nocturnal dyspnea. She does have some issues with balance related to her spinal disease.  Past Medical History  Diagnosis Date  . Hypertension   . Hyponatremia   . Ascending aortic aneurysm 08/23/2011  . COPD (chronic obstructive pulmonary disease) 08/23/2011  . Cervical spondylosis with myelopathy   . Occipital neuralgia   . Arthritis   . Chronic kidney disease     urine incontinence   Past Surgical History  Procedure Laterality Date  . Back surgery      multiple  . Spinal fusion      cervical fusion  . Spinal fusion  2003      Current Outpatient Prescriptions  Medication Sig Dispense Refill  . acetaminophen-codeine (TYLENOL #3) 300-30 MG per tablet Take 1 tablet by mouth every 6 (six) hours as needed for pain.     Marland Kitchen aspirin-acetaminophen-caffeine (EXCEDRIN MIGRAINE) 250-250-65 MG per tablet Take 1 tablet by mouth every 8 (eight) hours as needed for pain.     . Calcium Carbonate-Vitamin D (CALTRATE 600+D PO) Take 1 tablet by mouth.    . cholecalciferol (VITAMIN D) 1000 UNITS tablet Take 1,000 Units by mouth daily.    . clorazepate (TRANXENE) 3.75 MG tablet Take 3.75 mg by mouth 3 (three) times daily.     Marland Kitchen dextromethorphan-guaiFENesin (MUCINEX DM) 30-600 MG per 12 hr tablet  Take 1 tablet by mouth 2 (two) times daily. 10 tablet 0  . lactose free nutrition (BOOST) LIQD Take 237 mLs by mouth 3 (three) times daily between meals.     No current facility-administered medications for this visit.    Physical Exam BP 123/88 mmHg  Pulse 72  Resp 18  Ht 5' (1.524 m)  Wt 65 lb (29.484 kg)  BMI 12.69 kg/m2  SpO85 48% 69 year old woman in no acute distress Alert and oriented 3 Limited range of motion of neck Cardiac regular rate and rhythm normal S1 and S2, positive S4, no audible murmur Lungs clear No peripheral edema  Diagnostic Tests: MRA CHEST WITH OR WITHOUT CONTRAST  TECHNIQUE: Angiographic images of the chest were obtained using MRA technique without and with intravenous contrast.  CONTRAST: 3mL MULTIHANCE GADOBENATE DIMEGLUMINE 529 MG/ML IV SOLN  COMPARISON: 03/06/2013  FINDINGS: There is stable aneurysmal dilatation of the ascending thoracic aorta which measures 3.9 cm in greatest diameter. This remains relatively dilated compared to caliber of the normal descending thoracic aorta of 2.0 cm. There is no aortic dilatation at the sinuses of Valsalva. The proximal arch measures 2.7 cm. The distal arch measures 2.3 cm. Proximal great vessels show normal patency with normal variant anatomy of a separate origin of the left vertebral artery off of the aortic arch. There is no evidence of aortic dissection or rupture.  The heart size is normal. No  pleural or pericardial fluid is identified. No visualized masses or enlarged lymph nodes. Pulmonary arteries are normal in caliber.  IMPRESSION: Stable relative aneurysmal disease of the ascending thoracic aorta with maximal diameter of 3.9 cm.   Electronically Signed  By: Aletta Edouard M.D.  On: 03/19/2014 13:08  Impression: Ms. Bigbee is a 69 year old woman with a known 4 cm ascending aortic aneurysm. We have been following this with annual MR angios. She returns today for a 1 year  follow-up. I reviewed the MR angiogram and concur with the radiologist reading as noted above. The maximal diameter remains unchanged at 4 cm.  Her blood pressure is well controlled. She does not have a murmur to suggest significant valvular heart disease. She is asymptomatic. There is no indication for surgery at this time.  We will continue to follow her with annual MR angiograms. I will see her back in one year.   Melrose Nakayama M.D.

## 2014-07-21 ENCOUNTER — Inpatient Hospital Stay (HOSPITAL_COMMUNITY)
Admission: EM | Admit: 2014-07-21 | Discharge: 2014-07-24 | DRG: 193 | Disposition: A | Discharge status: Home Health | Payer: Medicare Other | Attending: Internal Medicine | Admitting: Internal Medicine

## 2014-07-21 ENCOUNTER — Emergency Department (HOSPITAL_COMMUNITY): Payer: Medicare Other

## 2014-07-21 ENCOUNTER — Encounter (HOSPITAL_COMMUNITY): Payer: Self-pay | Admitting: Emergency Medicine

## 2014-07-21 DIAGNOSIS — Z981 Arthrodesis status: Secondary | ICD-10-CM | POA: Diagnosis not present

## 2014-07-21 DIAGNOSIS — E871 Hypo-osmolality and hyponatremia: Secondary | ICD-10-CM | POA: Diagnosis present

## 2014-07-21 DIAGNOSIS — Z79899 Other long term (current) drug therapy: Secondary | ICD-10-CM

## 2014-07-21 DIAGNOSIS — M5481 Occipital neuralgia: Secondary | ICD-10-CM | POA: Diagnosis present

## 2014-07-21 DIAGNOSIS — J189 Pneumonia, unspecified organism: Secondary | ICD-10-CM | POA: Diagnosis present

## 2014-07-21 DIAGNOSIS — E43 Unspecified severe protein-calorie malnutrition: Secondary | ICD-10-CM | POA: Diagnosis present

## 2014-07-21 DIAGNOSIS — I712 Thoracic aortic aneurysm, without rupture: Secondary | ICD-10-CM | POA: Diagnosis present

## 2014-07-21 DIAGNOSIS — Z681 Body mass index (BMI) 19 or less, adult: Secondary | ICD-10-CM

## 2014-07-21 DIAGNOSIS — I10 Essential (primary) hypertension: Secondary | ICD-10-CM | POA: Diagnosis present

## 2014-07-21 DIAGNOSIS — F17200 Nicotine dependence, unspecified, uncomplicated: Secondary | ICD-10-CM

## 2014-07-21 DIAGNOSIS — E876 Hypokalemia: Secondary | ICD-10-CM | POA: Diagnosis present

## 2014-07-21 DIAGNOSIS — F17210 Nicotine dependence, cigarettes, uncomplicated: Secondary | ICD-10-CM | POA: Diagnosis present

## 2014-07-21 DIAGNOSIS — E860 Dehydration: Secondary | ICD-10-CM | POA: Diagnosis present

## 2014-07-21 DIAGNOSIS — R05 Cough: Secondary | ICD-10-CM | POA: Diagnosis not present

## 2014-07-21 DIAGNOSIS — F5000 Anorexia nervosa, unspecified: Secondary | ICD-10-CM | POA: Diagnosis present

## 2014-07-21 DIAGNOSIS — D696 Thrombocytopenia, unspecified: Secondary | ICD-10-CM | POA: Diagnosis present

## 2014-07-21 DIAGNOSIS — J449 Chronic obstructive pulmonary disease, unspecified: Secondary | ICD-10-CM | POA: Diagnosis present

## 2014-07-21 DIAGNOSIS — M1990 Unspecified osteoarthritis, unspecified site: Secondary | ICD-10-CM | POA: Diagnosis present

## 2014-07-21 LAB — CBC WITH DIFFERENTIAL/PLATELET
BASOS PCT: 0 % (ref 0–1)
Basophils Absolute: 0 10*3/uL (ref 0.0–0.1)
EOS PCT: 0 % (ref 0–5)
Eosinophils Absolute: 0 10*3/uL (ref 0.0–0.7)
HEMATOCRIT: 50 % — AB (ref 36.0–46.0)
Hemoglobin: 17.3 g/dL — ABNORMAL HIGH (ref 12.0–15.0)
LYMPHS PCT: 4 % — AB (ref 12–46)
Lymphs Abs: 0.5 10*3/uL — ABNORMAL LOW (ref 0.7–4.0)
MCH: 29.7 pg (ref 26.0–34.0)
MCHC: 34.6 g/dL (ref 30.0–36.0)
MCV: 85.9 fL (ref 78.0–100.0)
Monocytes Absolute: 1.3 10*3/uL — ABNORMAL HIGH (ref 0.1–1.0)
Monocytes Relative: 12 % (ref 3–12)
Neutro Abs: 9.4 10*3/uL — ABNORMAL HIGH (ref 1.7–7.7)
Neutrophils Relative %: 84 % — ABNORMAL HIGH (ref 43–77)
PLATELETS: 139 10*3/uL — AB (ref 150–400)
RBC: 5.82 MIL/uL — AB (ref 3.87–5.11)
RDW: 14 % (ref 11.5–15.5)
WBC: 11.2 10*3/uL — ABNORMAL HIGH (ref 4.0–10.5)

## 2014-07-21 LAB — COMPREHENSIVE METABOLIC PANEL
ALT: 19 U/L (ref 14–54)
AST: 30 U/L (ref 15–41)
Albumin: 3.9 g/dL (ref 3.5–5.0)
Alkaline Phosphatase: 78 U/L (ref 38–126)
Anion gap: 9 (ref 5–15)
BUN: 24 mg/dL — ABNORMAL HIGH (ref 6–20)
CO2: 29 mmol/L (ref 22–32)
Calcium: 10.3 mg/dL (ref 8.9–10.3)
Chloride: 94 mmol/L — ABNORMAL LOW (ref 101–111)
Creatinine, Ser: 0.49 mg/dL (ref 0.44–1.00)
GFR calc Af Amer: >60 mL/min (ref >60)
GFR calc non Af Amer: >60 mL/min (ref >60)
Glucose, Bld: 137 mg/dL — ABNORMAL HIGH (ref 65–99)
Potassium: 3.5 mmol/L (ref 3.5–5.1)
Sodium: 132 mmol/L — ABNORMAL LOW (ref 135–145)
Total Bilirubin: 0.8 mg/dL (ref 0.3–1.2)
Total Protein: 7.5 g/dL (ref 6.5–8.1)

## 2014-07-21 MED ORDER — DEXTROSE 5 % IV SOLN
500.0000 mg | Freq: Once | INTRAVENOUS | Status: DC
  Filled 2014-07-21: qty 500

## 2014-07-21 MED ORDER — DEXTROSE 5 % IV SOLN
1.0000 g | Freq: Once | INTRAVENOUS | Status: AC
  Administered 2014-07-21: 1 g via INTRAVENOUS
  Filled 2014-07-21: qty 10

## 2014-07-21 MED ORDER — DM-GUAIFENESIN ER 30-600 MG PO TB12
1.0000 | ORAL_TABLET | Freq: Two times a day (BID) | ORAL | Status: DC
  Administered 2014-07-21 – 2014-07-22 (×2): 1 via ORAL
  Filled 2014-07-21 (×3): qty 1

## 2014-07-21 MED ORDER — LEVOFLOXACIN IN D5W 750 MG/150ML IV SOLN
750.0000 mg | INTRAVENOUS | Status: DC
  Filled 2014-07-21: qty 150

## 2014-07-21 MED ORDER — SODIUM CHLORIDE 0.9 % IV BOLUS (SEPSIS)
500.0000 mL | Freq: Once | INTRAVENOUS | Status: AC
  Administered 2014-07-21: 500 mL via INTRAVENOUS

## 2014-07-21 MED ORDER — CLORAZEPATE DIPOTASSIUM 3.75 MG PO TABS: 3.7500 mg | ORAL_TABLET | Freq: Three times a day (TID) | ORAL | Status: DC | PRN

## 2014-07-21 MED ORDER — LEVOFLOXACIN IN D5W 750 MG/150ML IV SOLN
750.0000 mg | INTRAVENOUS | Status: DC
  Administered 2014-07-21: 750 mg via INTRAVENOUS
  Filled 2014-07-21: qty 150

## 2014-07-21 MED ORDER — ACETAMINOPHEN-CODEINE #3 300-30 MG PO TABS
1.0000 | ORAL_TABLET | Freq: Four times a day (QID) | ORAL | Status: DC | PRN
  Administered 2014-07-22 – 2014-07-24 (×6): 1 via ORAL
  Filled 2014-07-21 (×6): qty 1

## 2014-07-21 MED ORDER — SODIUM CHLORIDE 0.9 % IV SOLN
INTRAVENOUS | Status: DC
  Administered 2014-07-21 – 2014-07-22 (×2): via INTRAVENOUS

## 2014-07-21 NOTE — H&P (Signed)
History and Physical  Regina [name] EAV:[id number] DOB: 1945/12/23 DOA: 07/21/2014  Referring physician: Dr. Stark Jock in ED PCP: Stephens Shire, MD   Chief Complaint: cough  HPI:  68 year old woman, smoker, presented with increased cough, fatigue, loss of appetite. Poor oral intake. Initial evaluation revealed pneumonia.  Symptoms began 6/1 with increased cough, malaise, fatigue, worsening of already poor appetite with minimal oral intake. Also several episodes of diarrhea. No nausea or vomiting. No shortness of breath. No fever or systemic symptoms. No specific aggravating or alleviating factors.  In the emergency department afebrile, VSS, no hypoxia. Sodium 132, CMP otherwise unremarkable. WBC 11.2. Platelet count 139. Chest x-ray independently reviewed COPD, left base infiltrate. Concur with radiology interpretation.  Review of Systems:  Negative for fever, visual changes, sore throat, rash, new muscle aches, chest pain, SOB, dysuria, bleeding, n/v/abdominal pain.  Past Medical History  Diagnosis Date  . Hypertension   . Hyponatremia   . Ascending aortic aneurysm 08/23/2011  . COPD (chronic obstructive pulmonary disease) 08/23/2011  . Cervical spondylosis with myelopathy   . Occipital neuralgia   . Arthritis   . Chronic kidney disease     urine incontinence    Past Surgical History  Procedure Laterality Date  . Back surgery      multiple  . Spinal fusion      cervical fusion  . Spinal fusion  2003    Social History:  reports that she has been smoking Cigarettes.  She has a 48 pack-year smoking history. She has never used smokeless tobacco. She reports that she drinks about 7.0 oz of alcohol per week. She reports that she does not use illicit drugs. lives with their spouse Self-care  Allergies  Allergen Reactions  . Erythromycin Rash    Family History  Problem Relation Age of Onset  . Heart attack Father   . Stroke Mother      Prior to Admission medications     Medication Sig Start Date End Date Taking? Authorizing Provider  acetaminophen-codeine (TYLENOL #3) 300-30 MG per tablet Take 1 tablet by mouth every 6 (six) hours as needed for pain.     Historical Provider, MD  aspirin-acetaminophen-caffeine (EXCEDRIN MIGRAINE) [phone] MG per tablet Take 1 tablet by mouth every 8 (eight) hours as needed for pain.     Historical Provider, MD  Calcium Carbonate-Vitamin D (CALTRATE 600+D PO) Take 1 tablet by mouth.    Historical Provider, MD  cholecalciferol (VITAMIN D) 1000 UNITS tablet Take 1,000 Units by mouth daily.    Historical Provider, MD  clorazepate (TRANXENE) 3.75 MG tablet Take 3.75 mg by mouth 3 (three) times daily.     Historical Provider, MD  dextromethorphan-guaiFENesin (MUCINEX DM) 30-600 MG per 12 hr tablet Take 1 tablet by mouth 2 (two) times daily. 09/03/12   Shanker Kristeen Mans, MD  lactose free nutrition (BOOST) LIQD Take 237 mLs by mouth 3 (three) times daily between meals.    Historical Provider, MD   Physical Exam: Filed Vitals:   07/21/14 1513  BP: 135/94  Pulse: 86  Temp: 98.1 F (36.7 C)  TempSrc: Oral  Resp: 20  SpO2: 95%    General:  Appears calm and comfortable, may CAD Eyes: PERRL, normal lids, irises   ENT: grossly normal hearing, lips & tongue Neck: no LAD, masses or thyromegaly Cardiovascular: RRR, no m/r/g. No LE edema. Respiratory: CTA bilaterally, no w/r/r. Normal respiratory effort. Abdomen: soft, ntnd Skin: no rash or induration seen on limited exam Musculoskeletal: grossly normal tone BUE/BLE  Psychiatric: grossly normal mood and affect, speech fluent and appropriate Neurologic: grossly non-focal.  Wt Readings from Last 3 Encounters:  03/19/14 29.484 kg (65 lb)  03/06/13 29.03 kg (64 lb)  08/31/12 27.805 kg (61 lb 4.8 oz)    Labs on Admission:  Basic Metabolic Panel:  Recent Labs Lab 07/21/14 1604  NA 132*  K 3.5  CL 94*  CO2 29  GLUCOSE 137*  BUN 24*  CREATININE 0.49  CALCIUM 10.3     Liver Function Tests:  Recent Labs Lab 07/21/14 1604  AST 30  ALT 19  ALKPHOS 78  BILITOT 0.8  PROT 7.5  ALBUMIN 3.9    CBC:  Recent Labs Lab 07/21/14 1604  WBC 11.2*  NEUTROABS 9.4*  HGB 17.3*  HCT 50.0*  MCV 85.9  PLT 139*      Radiological Exams on Admission: Dg Chest 2 View  07/21/2014   CLINICAL DATA:  Productive cough, loss of appetite and fatigue since Thursday, diarrhea, congestion, history COPD, hypertension, chronic kidney disease  EXAM: CHEST  2 VIEW  COMPARISON:  09/01/2012  FINDINGS: Normal heart size, mediastinal contours and pulmonary vascularity.  Atherosclerotic calcification aorta.  Severe emphysematous changes consistent with COPD.  New LEFT lower lobe infiltrate consistent with pneumonia.  Remaining lungs clear.  Minimal LEFT pleural effusion.  No pneumothorax.  Bones demineralized.  Prior cervical spine fusion.  IMPRESSION: COPD changes with LEFT lower lobe infiltrate consistent with pneumonia.   Electronically Signed   By: Lavonia Dana M.D.   On: 07/21/2014 16:08    EKG: none done.   Principal Problem:   CAP (community acquired pneumonia) Active Problems:   COPD (chronic obstructive pulmonary disease)   Tobacco dependence   Anorexia nervosa   Protein-calorie malnutrition, severe   Assessment/Plan 1. CAP. No hypoxia. Appears stable. 2. Hyponatremia, suspect hypovolemic. 3. Dehydration. 4. Thrombocytopenia, modest. Likely related to infection. 5. COPD, appears stable 6. Anorexia nervosa 7. Severe protein malnutrition. 8. Tobacco dependence. 1ppd   Appears stable for admission to medical floor  Empiric antibiotics with pharmacy assistance based on her weight  CBC, basic metabolic panel in the morning  IV fluids  Discussed with husband at bedside  Code Status: full code  DVT prophylaxis: SCDs Family Communication:  Disposition Plan/Anticipated LOS: admit, 2 days  Time spent: 33 minutes  Murray Hodgkins, MD  [facility] Pager [phone] 07/21/2014, 5:36 PM

## 2014-07-21 NOTE — ED Notes (Signed)
Pt c/o productive cough, loss of appetite, and fatigue since Thursday. Pt A&Ox4. Pt sts she hasn't really eaten since Thursday. Pt also c/o diarrhea. Denies N/V. Denies chest pain and SOB but sts "I am very congested."

## 2014-07-21 NOTE — ED Provider Notes (Signed)
CSN: [id number]     Arrival date & time 07/21/14  1456 History   First MD Initiated Contact with Patient 07/21/14 1703     Chief Complaint  Patient presents with  . Cough  . loss of appetite   . Fatigue     (Consider location/radiation/quality/duration/timing/severity/associated sxs/prior Treatment) HPI Comments: 69 year old female who presents for evaluation of chest congestion and productive cough for the past several days. The patient has been very fatigued, has had no energy, and has not had much to eat or drink over the past 48 hours. She had a similar episode 2 years ago which was pneumonia.  Patient is a 69 y.o. female presenting with cough. The history is provided by the patient.  Cough Cough characteristics:  Productive Severity:  Moderate Onset quality:  Sudden Duration:  3 days Timing:  Constant Progression:  Worsening Chronicity:  New Smoker: yes   Relieved by:  Nothing Worsened by:  Nothing tried Ineffective treatments:  None tried Associated symptoms: no chest pain and no fever     Past Medical History  Diagnosis Date  . Hypertension   . Hyponatremia   . Ascending aortic aneurysm 08/23/2011  . COPD (chronic obstructive pulmonary disease) 08/23/2011  . Cervical spondylosis with myelopathy   . Occipital neuralgia   . Arthritis   . Chronic kidney disease     urine incontinence   Past Surgical History  Procedure Laterality Date  . Back surgery      multiple  . Spinal fusion      cervical fusion  . Spinal fusion  2003   Family History  Problem Relation Age of Onset  . Heart attack Father   . Stroke Mother    History  Substance Use Topics  . Smoking status: Current Every Day Smoker -- 1.00 packs/day for 48 years    Types: Cigarettes  . Smokeless tobacco: Never Used  . Alcohol Use: 7.0 oz/week    14 drink(s) per week     Comment: occassional wine   OB History    No data available     Review of Systems  Constitutional: Negative for fever.   Respiratory: Positive for cough.   Cardiovascular: Negative for chest pain.  All other systems reviewed and are negative.     Allergies  Erythromycin  Home Medications   Prior to Admission medications   Medication Sig Start Date End Date Taking? Authorizing Provider  acetaminophen-codeine (TYLENOL #3) 300-30 MG per tablet Take 1 tablet by mouth every 6 (six) hours as needed for pain.     Historical Provider, MD  aspirin-acetaminophen-caffeine (EXCEDRIN MIGRAINE) [phone] MG per tablet Take 1 tablet by mouth every 8 (eight) hours as needed for pain.     Historical Provider, MD  Calcium Carbonate-Vitamin D (CALTRATE 600+D PO) Take 1 tablet by mouth.    Historical Provider, MD  cholecalciferol (VITAMIN D) 1000 UNITS tablet Take 1,000 Units by mouth daily.    Historical Provider, MD  clorazepate (TRANXENE) 3.75 MG tablet Take 3.75 mg by mouth 3 (three) times daily.     Historical Provider, MD  dextromethorphan-guaiFENesin (MUCINEX DM) 30-600 MG per 12 hr tablet Take 1 tablet by mouth 2 (two) times daily. 09/03/12   Shanker Kristeen Mans, MD  lactose free nutrition (BOOST) LIQD Take 237 mLs by mouth 3 (three) times daily between meals.    Historical Provider, MD   BP 135/94 mmHg  Pulse 86  Temp(Src) 98.1 F (36.7 C) (Oral)  Resp 20  SpO2 95%  Physical Exam  Constitutional: She is oriented to person, place, and time. No distress.  Patient is very thin and cachectic.  HENT:  Head: Normocephalic and atraumatic.  Mouth/Throat: Oropharynx is clear and moist.  Neck: Normal range of motion. Neck supple.  Cardiovascular: Normal rate and regular rhythm.  Exam reveals no gallop and no friction rub.   No murmur heard. Pulmonary/Chest: Effort normal. No respiratory distress. She has no wheezes. She has rales.  There are rales in the left base.  Abdominal: Soft. Bowel sounds are normal. She exhibits no distension. There is no tenderness.  Musculoskeletal: Normal range of motion. She exhibits no  edema.  Lymphadenopathy:    She has no cervical adenopathy.  Neurological: She is alert and oriented to person, place, and time.  Skin: Skin is warm and dry. She is not diaphoretic.  Nursing note and vitals reviewed.   ED Course  Procedures (including critical care time) Labs Review Labs Reviewed  CBC WITH DIFFERENTIAL/PLATELET - Abnormal; Notable for the following:    WBC 11.2 (*)    RBC 5.82 (*)    Hemoglobin 17.3 (*)    HCT 50.0 (*)    Platelets 139 (*)    Neutrophils Relative % 84 (*)    Neutro Abs 9.4 (*)    Lymphocytes Relative 4 (*)    Lymphs Abs 0.5 (*)    Monocytes Absolute 1.3 (*)    All other components within normal limits  COMPREHENSIVE METABOLIC PANEL - Abnormal; Notable for the following:    Sodium 132 (*)    Chloride 94 (*)    Glucose, Bld 137 (*)    BUN 24 (*)    All other components within normal limits  URINALYSIS, ROUTINE W REFLEX MICROSCOPIC (NOT AT [facility])    Imaging Review Dg Chest 2 View  07/21/2014   CLINICAL DATA:  Productive cough, loss of appetite and fatigue since Thursday, diarrhea, congestion, history COPD, hypertension, chronic kidney disease  EXAM: CHEST  2 VIEW  COMPARISON:  09/01/2012  FINDINGS: Normal heart size, mediastinal contours and pulmonary vascularity.  Atherosclerotic calcification aorta.  Severe emphysematous changes consistent with COPD.  New LEFT lower lobe infiltrate consistent with pneumonia.  Remaining lungs clear.  Minimal LEFT pleural effusion.  No pneumothorax.  Bones demineralized.  Prior cervical spine fusion.  IMPRESSION: COPD changes with LEFT lower lobe infiltrate consistent with pneumonia.   Electronically Signed   By: Lavonia Dana M.D.   On: 07/21/2014 16:08     EKG Interpretation None      MDM   Final diagnoses:  None    Patient with history of COPD, hypertension, and anorexia. She presents for evaluation of weakness and decreased by mouth intake. She has also had chest congestion and cough. Today's workup  reveals a white count of 11,000 and chest x-ray reveals a left lower lobe infiltrate. Her electrolytes indicate mild to moderate dehydration. She will be given IV fluids, IV antibiotics, and I believe admission to the hospital is most appropriate. I've spoken with Dr. Sarajane Jews from the hospitalist service who agrees to admit.    Veryl Speak, MD 07/21/14 [phone]

## 2014-07-22 LAB — CBC
HCT: 41.5 % (ref 36.0–46.0)
HEMOGLOBIN: 13.6 g/dL (ref 12.0–15.0)
MCH: 28.3 pg (ref 26.0–34.0)
MCHC: 32.8 g/dL (ref 30.0–36.0)
MCV: 86.5 fL (ref 78.0–100.0)
Platelets: 102 10*3/uL — ABNORMAL LOW (ref 150–400)
RBC: 4.8 MIL/uL (ref 3.87–5.11)
RDW: 13.7 % (ref 11.5–15.5)
WBC: 6.9 10*3/uL (ref 4.0–10.5)

## 2014-07-22 LAB — BASIC METABOLIC PANEL
Anion gap: 8 (ref 5–15)
BUN: 17 mg/dL (ref 6–20)
CALCIUM: 9.4 mg/dL (ref 8.9–10.3)
CHLORIDE: 100 mmol/L — AB (ref 101–111)
CO2: 27 mmol/L (ref 22–32)
Creatinine, Ser: <0.3 mg/dL — ABNORMAL LOW (ref 0.44–1.00)
GLUCOSE: 95 mg/dL (ref 65–99)
Potassium: 3.5 mmol/L (ref 3.5–5.1)
Sodium: 135 mmol/L (ref 135–145)

## 2014-07-22 LAB — EXPECTORATED SPUTUM ASSESSMENT W REFEX TO RESP CULTURE

## 2014-07-22 LAB — STREP PNEUMONIAE URINARY ANTIGEN: STREP PNEUMO URINARY ANTIGEN: NEGATIVE

## 2014-07-22 LAB — EXPECTORATED SPUTUM ASSESSMENT W GRAM STAIN, RFLX TO RESP C

## 2014-07-22 LAB — HIV ANTIBODY (ROUTINE TESTING W REFLEX): HIV Screen 4th Generation wRfx: NONREACTIVE

## 2014-07-22 MED ORDER — ADULT MULTIVITAMIN W/MINERALS CH
1.0000 | ORAL_TABLET | Freq: Every day | ORAL | Status: DC
  Administered 2014-07-24: 1 via ORAL
  Filled 2014-07-22 (×3): qty 1

## 2014-07-22 MED ORDER — ENSURE ENLIVE PO LIQD
237.0000 mL | Freq: Two times a day (BID) | ORAL | Status: DC
  Administered 2014-07-24: 50 mL via ORAL

## 2014-07-22 MED ORDER — LEVOFLOXACIN IN D5W 250 MG/50ML IV SOLN
250.0000 mg | INTRAVENOUS | Status: DC
  Administered 2014-07-22 – 2014-07-23 (×2): 250 mg via INTRAVENOUS
  Filled 2014-07-22 (×2): qty 50

## 2014-07-22 MED ORDER — IPRATROPIUM-ALBUTEROL 0.5-2.5 (3) MG/3ML IN SOLN: 3.0000 mL | RESPIRATORY_TRACT | Status: DC | PRN

## 2014-07-22 MED ORDER — BENZONATATE 100 MG PO CAPS: 100.0000 mg | ORAL_CAPSULE | Freq: Three times a day (TID) | ORAL | Status: DC | PRN

## 2014-07-22 NOTE — Progress Notes (Signed)
  [facility] PROGRESS NOTE  Regina [name] WUJ:[id number] DOB: 28-Aug-1945 DOA: 07/21/2014 PCP: Stephens Shire, MD  Assessment/Plan:  Principal Problem:   CAP (community acquired pneumonia): continue abx Active Problems:   COPD (chronic obstructive pulmonary disease) no wheeze   Tobacco dependence   Anorexia nervosa   Protein-calorie malnutrition, severe   Community acquired pneumonia  Pt eval  Code Status:  full Family Communication:   Disposition Plan:  Per pt  Consultants:    Procedures:     Antibiotics:    HPI/Subjective: Weak. Some cough. Eating ok  Objective: Filed Vitals:   07/22/14 0617  BP: 114/64  Pulse: 66  Temp: 98.3 F (36.8 C)  Resp: 18    Intake/Output Summary (Last 24 hours) at 07/22/14 1252 Last data filed at 07/22/14 0815  Gross per 24 hour  Intake    790 ml  Output    550 ml  Net    240 ml   Filed Weights   07/21/14 2035  Weight: 29.484 kg (65 lb)    Exam:   General:  cachexic eating. Slightly confused  Cardiovascular: RRR  Respiratory: slight rales  Abdomen: S, NT, ND  Ext: no CCE  Basic Metabolic Panel:  Recent Labs Lab 07/21/14 1604  NA 132*  K 3.5  CL 94*  CO2 29  GLUCOSE 137*  BUN 24*  CREATININE 0.49  CALCIUM 10.3   Liver Function Tests:  Recent Labs Lab 07/21/14 1604  AST 30  ALT 19  ALKPHOS 78  BILITOT 0.8  PROT 7.5  ALBUMIN 3.9   No results for input(s): LIPASE, AMYLASE in the last 168 hours. No results for input(s): AMMONIA in the last 168 hours. CBC:  Recent Labs Lab 07/21/14 1604  WBC 11.2*  NEUTROABS 9.4*  HGB 17.3*  HCT 50.0*  MCV 85.9  PLT 139*   Cardiac Enzymes: No results for input(s): CKTOTAL, CKMB, CKMBINDEX, TROPONINI in the last 168 hours. BNP (last 3 results) No results for input(s): BNP in the last 8760 hours.  ProBNP (last 3 results) No results for input(s): PROBNP in the last 8760 hours.  CBG: No results for input(s): GLUCAP in the last 168  hours.  No results found for this or any previous visit (from the past 240 hour(s)).   Studies: Dg Chest 2 View  07/21/2014   CLINICAL DATA:  Productive cough, loss of appetite and fatigue since Thursday, diarrhea, congestion, history COPD, hypertension, chronic kidney disease  EXAM: CHEST  2 VIEW  COMPARISON:  09/01/2012  FINDINGS: Normal heart size, mediastinal contours and pulmonary vascularity.  Atherosclerotic calcification aorta.  Severe emphysematous changes consistent with COPD.  New LEFT lower lobe infiltrate consistent with pneumonia.  Remaining lungs clear.  Minimal LEFT pleural effusion.  No pneumothorax.  Bones demineralized.  Prior cervical spine fusion.  IMPRESSION: COPD changes with LEFT lower lobe infiltrate consistent with pneumonia.   Electronically Signed   By: Lavonia Dana M.D.   On: 07/21/2014 16:08    Scheduled Meds: . dextromethorphan-guaiFENesin  1 tablet Oral BID  . feeding supplement (ENSURE ENLIVE)  237 mL Oral BID BM  . levofloxacin (LEVAQUIN) IV  250 mg Intravenous Q24H  . multivitamin with minerals  1 tablet Oral Daily   Continuous Infusions: . sodium chloride 50 mL/hr at 07/21/14 2200    Time spent: 35 minutes  [facility] Pager [phone]. If 7PM-7AM, please contact night-coverage at [email or web address], password [facility] 07/22/2014, 12:52 PM  LOS: 1 day

## 2014-07-22 NOTE — Progress Notes (Signed)
Initial Nutrition Assessment  DOCUMENTATION CODES:  Severe malnutrition in context of social or environmental circumstances, Underweight  INTERVENTION:  Provide Magic cup TID with meals, each supplement provides 290 kcal and 9 grams of protein Placed patient on meal order with assist Provide daily snacks Multivitamin with minerals daily RD to continue to monitor  NUTRITION DIAGNOSIS:  Malnutrition related to social / environmental circumstances as evidenced by severe depletion of body fat, severe depletion of muscle mass.  GOAL:  Patient will meet greater than or equal to 90% of their needs   MONITOR:  PO intake, Supplement acceptance, Labs, Weight trends, Skin, I & O's  REASON FOR ASSESSMENT:  Consult Assessment of nutrition requirement/status  ASSESSMENT: 69 year old woman, smoker, presented with increased cough, fatigue, loss of appetite. Poor oral intake. Initial evaluation revealed pneumonia. Pt with history of anorexia nervosa.  Pt reports not eating well for the past week. Suspect given history of anorexia that pt has not been eating well for longer period of time.  Pt reports eating toast and coffee for breakfast. RD to place patient on meal order with assist. Pt's weight has remained stable (~65 lb over the last 2 years).  Pt states she likes Ensure/Boost supplements but they cause loose stools. Pt would like snacks. RD to order.  Nutrition-Focused physical exam completed. Findings are severe fat depletion, severe muscle depletion, and no edema.   Labs reviewed: Low Na Elevated BUN Glucose 137  Height:  Ht Readings from Last 1 Encounters:  07/21/14 5' (1.524 m)    Weight:  Wt Readings from Last 1 Encounters:  07/21/14 65 lb (29.484 kg)    Ideal Body Weight:  45.5 kg  Wt Readings from Last 10 Encounters:  07/21/14 65 lb (29.484 kg)  03/19/14 65 lb (29.484 kg)  03/06/13 64 lb (29.03 kg)  08/31/12 61 lb 4.8 oz (27.805 kg)  06/01/12 67 lb 12.8  oz (30.754 kg)  03/07/12 64 lb (29.03 kg)  08/24/11 64 lb (29.03 kg)    BMI:  Body mass index is 12.69 kg/(m^2).  Estimated Nutritional Needs:  Kcal:  1100-1300  Protein:  60-80g  Fluid:  1.5L/day    Skin:  Reviewed, no issues  Diet Order:  Diet regular Room service appropriate?: Yes with Assist; Fluid consistency:: Thin  EDUCATION NEEDS:  No education needs identified at this time   Intake/Output Summary (Last 24 hours) at 07/22/14 1119 Last data filed at 07/22/14 0815  Gross per 24 hour  Intake    790 ml  Output    550 ml  Net    240 ml    Last BM:  6/5  Clayton Bibles, MS, RD, LDN Pager: [phone] After Hours Pager: [phone]

## 2014-07-22 NOTE — Progress Notes (Signed)
ANTIBIOTIC CONSULT NOTE   Pharmacy Consult for Levaquin Indication: CAP  Allergies  Allergen Reactions  . Azithromycin     rash  . Erythromycin Rash    Patient Measurements: Height: 5' (152.4 cm) Weight: 65 lb (29.484 kg) IBW/kg (Calculated) : 45.5   Vital Signs: Temp: 98.3 F (36.8 C) (06/06 0617) Temp Source: Oral (06/06 0617) BP: 114/64 mmHg (06/06 0617) Pulse Rate: 66 (06/06 0617) Intake/Output from previous day: 06/05 0701 - 06/06 0700 In: 790 [P.O.:240; I.V.:400; IV Piggyback:150] Out: 400 [Urine:400] Intake/Output from this shift: Total I/O In: -  Out: 150 [Urine:150]  Labs:  Recent Labs  07/21/14 1604  WBC 11.2*  HGB 17.3*  PLT 139*  CREATININE 0.49   Estimated Creatinine Clearance: 30.9 mL/min (by C-G formula based on Cr of 0.49).    Microbiology: Sputum culture: ordered but not yet collected  Medical History: Past Medical History  Diagnosis Date  . Hypertension   . Hyponatremia   . Ascending aortic aneurysm 08/23/2011  . COPD (chronic obstructive pulmonary disease) 08/23/2011  . Cervical spondylosis with myelopathy   . Occipital neuralgia   . Arthritis   . Chronic kidney disease     urine incontinence     Assessment: 69 y/o F with PMH including but not limited to COPD and anorexia nervosa admitted with CAP.  Due to patient's extremely small body habitus pharmacy was asked to assist with levofloxacin dosing.    Reduced CrCl (30 mL/min) in setting of SCr WNL; due to weight, age, gender.  Note that patient received levofloxacin 750 mg IV x 1 dose on 6/5 at 2200.  Goal of Therapy:  Appropriate abx dosing for indication and renal function. Eradication of infection  Plan:  1. Levofloxacin 250 mg IV q24h, next dose due tonight at 2200. 2. Follow clinical course, cultures, renal function.  Clayburn Pert, PharmD, BCPS Pager: [phone] 07/22/2014  12:17 PM

## 2014-07-23 DIAGNOSIS — E43 Unspecified severe protein-calorie malnutrition: Secondary | ICD-10-CM

## 2014-07-23 DIAGNOSIS — E876 Hypokalemia: Secondary | ICD-10-CM

## 2014-07-23 LAB — CBC
HCT: 40.7 % (ref 36.0–46.0)
HEMOGLOBIN: 13.3 g/dL (ref 12.0–15.0)
MCH: 28.8 pg (ref 26.0–34.0)
MCHC: 32.7 g/dL (ref 30.0–36.0)
MCV: 88.1 fL (ref 78.0–100.0)
Platelets: 102 10*3/uL — ABNORMAL LOW (ref 150–400)
RBC: 4.62 MIL/uL (ref 3.87–5.11)
RDW: 14 % (ref 11.5–15.5)
WBC: 5.1 10*3/uL (ref 4.0–10.5)

## 2014-07-23 LAB — LEGIONELLA ANTIGEN, URINE

## 2014-07-23 LAB — BASIC METABOLIC PANEL
ANION GAP: 7 (ref 5–15)
BUN: 10 mg/dL (ref 6–20)
CHLORIDE: 103 mmol/L (ref 101–111)
CO2: 26 mmol/L (ref 22–32)
Calcium: 9.1 mg/dL (ref 8.9–10.3)
Creatinine, Ser: 0.31 mg/dL — ABNORMAL LOW (ref 0.44–1.00)
GFR calc Af Amer: >60 mL/min (ref >60)
GFR calc non Af Amer: >60 mL/min (ref >60)
Glucose, Bld: 95 mg/dL (ref 65–99)
Potassium: 3 mmol/L — ABNORMAL LOW (ref 3.5–5.1)
SODIUM: 136 mmol/L (ref 135–145)

## 2014-07-23 MED ORDER — POTASSIUM CHLORIDE CRYS ER 20 MEQ PO TBCR: 40.0000 meq | EXTENDED_RELEASE_TABLET | ORAL | Status: DC

## 2014-07-23 MED ORDER — POTASSIUM CHLORIDE 20 MEQ/15ML (10%) PO SOLN
40.0000 meq | ORAL | Status: AC
  Administered 2014-07-23 (×2): 40 meq via ORAL
  Filled 2014-07-23 (×3): qty 30

## 2014-07-23 NOTE — Evaluation (Signed)
Physical Therapy Evaluation Patient Details Name: Regina [name] MRN: [id number] DOB: [birth year] Today's Date: 07/23/2014   History of Present Illness  69 y.o. female with h/o anorexia, COPD, neck fusion and multiple back surgeries admitted with PNA.   Clinical Impression  Pt admitted with above diagnosis. Pt currently with functional limitations due to the deficits listed below (see PT Problem List). Pt ambulated 47 with RW and had significant loss of balance requiring mod assist x 1, pt reports 10-20 falls in the past year at home. 24* assist recommended, however pt seems resistant to ST-SNF and doesn't have family help available. Her husband works.  Pt will benefit from skilled PT to increase their independence and safety with mobility to allow discharge to the venue listed below.       Follow Up Recommendations SNF;Supervision/Assistance - 24 hour (pt stated she's been to ST-SNF in the past and doesn't want to go back)    Equipment Recommendations  Wheelchair (measurements PT)    Recommendations for Other Services       Precautions / Restrictions Precautions Precautions: Fall Precaution Comments: pt reports "10-20" falls in the past year Restrictions Weight Bearing Restrictions: No      Mobility  Bed Mobility Overal bed mobility: Modified Independent             General bed mobility comments: HOB up 45*  Transfers Overall transfer level: Needs assistance Equipment used: Rolling walker (2 wheeled) Transfers: Sit to/from Stand Sit to Stand: Mod assist         General transfer comment: mod assist for balance due to posterior lean  Ambulation/Gait Ambulation/Gait assistance: Mod assist Ambulation Distance (Feet): 70 Feet Assistive device: Rolling walker (2 wheeled) Gait Pattern/deviations: Decreased step length - right;Decreased step length - left   Gait velocity interpretation: Below normal speed for age/gender General Gait Details: mod A for LOB posteriorly x  1  Stairs            Wheelchair Mobility    Modified Rankin (Stroke Patients Only)       Balance Overall balance assessment: Needs assistance   Sitting balance-Leahy Scale: Fair       Standing balance-Leahy Scale: Poor Standing balance comment: LOB posteriorly initially upon standing and x 1 with walking                             Pertinent Vitals/Pain Pain Location: chronic LBP -unrated Pain Intervention(s): Monitored during session;Premedicated before session    [place] expects to be discharged to:: Private residence Living Arrangements: Spouse/significant other Available Help at Discharge: Family   Home Access: Stairs to enter Entrance Stairs-Rails: Right Entrance Stairs-Number of Steps: 4   Home Equipment: Cane - single point;Walker - 2 wheels;Grab bars - toilet;Grab bars - tub/shower      Prior Function Level of Independence: Independent with assistive device(s)         Comments: uses RW at home, has had multiple falls     Hand Dominance        Extremity/Trunk Assessment   Upper Extremity Assessment: Generalized weakness           Lower Extremity Assessment: Generalized weakness (L knee extension AROM -20*; knee ext strength -4/5)      Cervical / Trunk Assessment: Other exceptions  Communication   Communication: No difficulties  Cognition Arousal/Alertness: Awake/alert Behavior During Therapy: WFL for tasks assessed/performed Overall Cognitive Status: Within Functional Limits for tasks assessed  General Comments      Exercises        Assessment/Plan    PT Assessment Patient needs continued PT services  PT Diagnosis Difficulty walking;Generalized weakness   PT Problem List Decreased strength;Decreased activity tolerance;Decreased balance;Decreased mobility  PT Treatment Interventions Gait training;Functional mobility training;Therapeutic activities;Balance  training;Therapeutic exercise   PT Goals (Current goals can be found in the Care Plan section) Acute Rehab PT Goals Patient Stated Goal: to go home PT Goal Formulation: With patient Time For Goal Achievement: 08/06/14 Potential to Achieve Goals: Fair    Frequency Min 3X/week   Barriers to discharge Decreased caregiver support husband works during the day    Co-evaluation               End of Session Equipment Utilized During Treatment: Gait belt Activity Tolerance: Patient tolerated treatment well Patient left: in bed;with call bell/phone within reach Nurse Communication: Mobility status         Time: 2010-0712 PT Time Calculation (min) (ACUTE ONLY): 26 min   Charges:   PT Evaluation $Initial PT Evaluation Tier I: 1 Procedure PT Treatments $Gait Training: 8-22 mins   PT G Codes:        Regina [name] 07/23/2014, 2:45 PM [phone]

## 2014-07-23 NOTE — Progress Notes (Signed)
[facility] PROGRESS NOTE  Regina [name] QQI:[id number] DOB: Jan 10, 1946 DOA: 07/21/2014 PCP: Stephens Shire, MD  Assessment/Plan:    CAP (community acquired pneumonia): continue abx    COPD (chronic obstructive pulmonary disease) no wheeze   Tobacco dependence   Anorexia nervosa   Protein-calorie malnutrition, severe Hypokalemia- replete, check Mg  Pt eval  Code Status:  full Family Communication:  patient Disposition Plan:  Per pt  Consultants:    Procedures:     Antibiotics:    HPI/Subjective: Coughing up sputum  Objective: Filed Vitals:   07/23/14 0602  BP: 104/62  Pulse: 62  Temp: 98.3 F (36.8 C)  Resp: 16    Intake/Output Summary (Last 24 hours) at 07/23/14 1213 Last data filed at 07/23/14 1000  Gross per 24 hour  Intake    650 ml  Output    800 ml  Net   -150 ml   Filed Weights   07/21/14 2035  Weight: 29.484 kg (65 lb)    Exam:   General:  cachexic  Cardiovascular: RRR  Respiratory: diminished, no wheezing  Abdomen: S, NT, ND  Ext: no CCE  Basic Metabolic Panel:  Recent Labs Lab 07/21/14 1604 07/23/14 0525  NA 132* 136  K 3.5 3.0*  CL 94* 103  CO2 29 26  GLUCOSE 137* 95  BUN 24* 10  CREATININE 0.49 0.31*  CALCIUM 10.3 9.1   Liver Function Tests:  Recent Labs Lab 07/21/14 1604  AST 30  ALT 19  ALKPHOS 78  BILITOT 0.8  PROT 7.5  ALBUMIN 3.9   No results for input(s): LIPASE, AMYLASE in the last 168 hours. No results for input(s): AMMONIA in the last 168 hours. CBC:  Recent Labs Lab 07/21/14 1604 07/23/14 0525  WBC 11.2* 5.1  NEUTROABS 9.4*  --   HGB 17.3* 13.3  HCT 50.0* 40.7  MCV 85.9 88.1  PLT 139* 102*   Cardiac Enzymes: No results for input(s): CKTOTAL, CKMB, CKMBINDEX, TROPONINI in the last 168 hours. BNP (last 3 results) No results for input(s): BNP in the last 8760 hours.  ProBNP (last 3 results) No results for input(s): PROBNP in the last 8760 hours.  CBG: No results for  input(s): GLUCAP in the last 168 hours.  Recent Results (from the past 240 hour(s))  Culture, respiratory (NON-Expectorated)     Status: None (Preliminary result)   Collection Time: 07/22/14  3:11 PM  Result Value Ref Range Status   Specimen Description SPUTUM  Final   Special Requests NONE  Final   Gram Stain PENDING  Incomplete   Culture NO GROWTH Performed at [facility]   Final   Report Status PENDING  Incomplete  Culture, sputum-assessment     Status: None   Collection Time: 07/22/14  4:01 PM  Result Value Ref Range Status   Specimen Description SPUTUM  Final   Special Requests NONE  Final   Sputum evaluation   Final    THIS SPECIMEN IS ACCEPTABLE. RESPIRATORY CULTURE REPORT TO FOLLOW.   Report Status 07/22/2014 FINAL  Final     Studies: Dg Chest 2 View  07/21/2014   CLINICAL DATA:  Productive cough, loss of appetite and fatigue since Thursday, diarrhea, congestion, history COPD, hypertension, chronic kidney disease  EXAM: CHEST  2 VIEW  COMPARISON:  09/01/2012  FINDINGS: Normal heart size, mediastinal contours and pulmonary vascularity.  Atherosclerotic calcification aorta.  Severe emphysematous changes consistent with COPD.  New LEFT lower lobe infiltrate consistent with pneumonia.  Remaining lungs clear.  Minimal LEFT pleural effusion.  No pneumothorax.  Bones demineralized.  Prior cervical spine fusion.  IMPRESSION: COPD changes with LEFT lower lobe infiltrate consistent with pneumonia.   Electronically Signed   By: Lavonia Dana M.D.   On: 07/21/2014 16:08    Scheduled Meds: . feeding supplement (ENSURE ENLIVE)  237 mL Oral BID BM  . levofloxacin (LEVAQUIN) IV  250 mg Intravenous Q24H  . multivitamin with minerals  1 tablet Oral Daily  . potassium chloride  40 mEq Oral Q4H   Continuous Infusions:    Time spent: 25 minutes  Eliseo Squires JESSICA  [facility] Pager [phone]. If 7PM-7AM, please contact night-coverage at [email or web address], password [facility] 07/23/2014,  12:13 PM  LOS: 2 days

## 2014-07-24 DIAGNOSIS — J189 Pneumonia, unspecified organism: Principal | ICD-10-CM

## 2014-07-24 LAB — CBC
HCT: 43.3 % (ref 36.0–46.0)
HEMOGLOBIN: 14 g/dL (ref 12.0–15.0)
MCH: 28.3 pg (ref 26.0–34.0)
MCHC: 32.3 g/dL (ref 30.0–36.0)
MCV: 87.7 fL (ref 78.0–100.0)
Platelets: 134 10*3/uL — ABNORMAL LOW (ref 150–400)
RBC: 4.94 MIL/uL (ref 3.87–5.11)
RDW: 13.8 % (ref 11.5–15.5)
WBC: 5.1 10*3/uL (ref 4.0–10.5)

## 2014-07-24 LAB — BASIC METABOLIC PANEL
Anion gap: 6 (ref 5–15)
BUN: 8 mg/dL (ref 6–20)
CHLORIDE: 100 mmol/L — AB (ref 101–111)
CO2: 27 mmol/L (ref 22–32)
Calcium: 9.2 mg/dL (ref 8.9–10.3)
Creatinine, Ser: 0.33 mg/dL — ABNORMAL LOW (ref 0.44–1.00)
GLUCOSE: 96 mg/dL (ref 65–99)
Potassium: 3.9 mmol/L (ref 3.5–5.1)
Sodium: 133 mmol/L — ABNORMAL LOW (ref 135–145)

## 2014-07-24 LAB — MAGNESIUM: Magnesium: 1.6 mg/dL — ABNORMAL LOW (ref 1.7–2.4)

## 2014-07-24 MED ORDER — LEVOFLOXACIN 250 MG PO TABS: 250.0000 mg | ORAL_TABLET | Freq: Every day | ORAL | Status: DC

## 2014-07-24 MED ORDER — ENSURE ENLIVE PO LIQD: 237.0000 mL | Freq: Two times a day (BID) | ORAL | Status: DC

## 2014-07-24 MED ORDER — MAGNESIUM OXIDE 400 (241.3 MG) MG PO TABS
800.0000 mg | ORAL_TABLET | Freq: Once | ORAL | Status: AC
  Administered 2014-07-24: 800 mg via ORAL
  Filled 2014-07-24: qty 2

## 2014-07-24 MED ORDER — BENZONATATE 100 MG PO CAPS: 100.0000 mg | ORAL_CAPSULE | Freq: Three times a day (TID) | ORAL | Status: DC | PRN

## 2014-07-24 NOTE — Care Management Note (Signed)
Case Management Note  Patient Details  Name: Regina [name] MRN: [id number] Date of Birth: 26-Oct-1945  Subjective/Objective:                   CAP Action/Plan:  Discharge planning  Expected Discharge Date:  07/24/14               Expected Discharge Plan:  [place]  In-House Referral:  Nutrition  Discharge planning Services  CM Consult  Post Acute Care Choice:  Home Health Choice offered to:  Patient, Adult Children  DME Arranged:    DME Agency:     HH Arranged:  PT [place] Agency:  [place]  Status of Service:  Completed, signed off  Medicare Important Message Given:    Date Medicare IM Given:    Medicare IM give by:    Date Additional Medicare IM Given:    Additional Medicare Important Message give by:     If discussed at [place] of Stay Meetings, dates discussed:    Additional Comments: CM spoke with pt and son to offer choice of home health agency.  Pt and son choose [facility] to render HHPT.  Address and contact information verified by pt.  Referral called to [facility] rep, Lecretia.  No other CM needs were communicated.  Dellie Catholic, RN 07/24/2014, 2:09 PM

## 2014-07-24 NOTE — Discharge Instructions (Signed)
Follow with Primary MD BURNETT,BRENT A, MD in 7 days   Get CBC, CMP, 2 view Chest X ray checked  by Primary MD next visit.    Activity: As tolerated with Full fall precautions use walker/cane & assistance as needed   Disposition Home    Diet: regular , with feeding assistance and aspiration precautions.  For Heart failure patients - Check your Weight same time everyday, if you gain over 2 pounds, or you develop in leg swelling, experience more shortness of breath or chest pain, call your Primary MD immediately. Follow Cardiac Low Salt Diet and 1.5 lit/day fluid restriction.   On your next visit with your primary care physician please Get Medicines reviewed and adjusted.   Please request your Prim.MD to go over all Hospital Tests and Procedure/Radiological results at the follow up, please get all Hospital records sent to your Prim MD by signing hospital release before you go home.   If you experience worsening of your admission symptoms, develop shortness of breath, life threatening emergency, suicidal or homicidal thoughts you must seek medical attention immediately by calling 911 or calling your MD immediately  if symptoms less severe.  You Must read complete instructions/literature along with all the possible adverse reactions/side effects for all the Medicines you take and that have been prescribed to you. Take any new Medicines after you have completely understood and accpet all the possible adverse reactions/side effects.   Do not drive, operating heavy machinery, perform activities at heights, swimming or participation in water activities or provide baby sitting services if your were admitted for syncope or siezures until you have seen by Primary MD or a Neurologist and advised to do so again.  Do not drive when taking Pain medications.    Do not take more than prescribed Pain, Sleep and Anxiety Medications  Special Instructions: If you have smoked or chewed Tobacco  in the  last 2 yrs please stop smoking, stop any regular Alcohol  and or any Recreational drug use.  Wear Seat belts while driving.   Please note  You were cared for by a hospitalist during your hospital stay. If you have any questions about your discharge medications or the care you received while you were in the hospital after you are discharged, you can call the unit and asked to speak with the hospitalist on call if the hospitalist that took care of you is not available. Once you are discharged, your primary care physician will handle any further medical issues. Please note that NO REFILLS for any discharge medications will be authorized once you are discharged, as it is imperative that you return to your primary care physician (or establish a relationship with a primary care physician if you do not have one) for your aftercare needs so that they can reassess your need for medications and monitor your lab values.

## 2014-07-24 NOTE — Discharge Summary (Signed)
Regina [name], is a 69 y.o. female  DOB Aug 19, 1945  MRN [id number].  Admission date:  07/21/2014  Admitting Physician  Samuella Cota, MD  Discharge Date:  07/24/2014   Primary MD  Stephens Shire, MD  Recommendations for primary care physician for things to follow:  - Check CBC, BMP, 2 view chest x-ray during next visit   Admission Diagnosis  Community acquired pneumonia [J18.9]   Discharge Diagnosis  Community acquired pneumonia [J18.9]   Principal Problem:   CAP (community acquired pneumonia) Active Problems:   COPD (chronic obstructive pulmonary disease)   Tobacco dependence   Anorexia nervosa   Protein-calorie malnutrition, severe   Community acquired pneumonia      Past Medical History  Diagnosis Date  . Hypertension   . Hyponatremia   . Ascending aortic aneurysm 08/23/2011  . COPD (chronic obstructive pulmonary disease) 08/23/2011  . Cervical spondylosis with myelopathy   . Occipital neuralgia   . Arthritis   . Chronic kidney disease     urine incontinence    Past Surgical History  Procedure Laterality Date  . Back surgery      multiple  . Spinal fusion      cervical fusion  . Spinal fusion  2003       History of present illness and  Hospital Course:     Kindly see H&P for history of present illness and admission details, please review complete Labs, Consult reports and Test reports for all details in brief  HPI  from the history and physical done on the day of admission   69 year old woman, smoker, presented with increased cough, fatigue, loss of appetite. Poor oral intake. Initial evaluation revealed pneumonia.  Symptoms began 6/1 with increased cough, malaise, fatigue, worsening of already poor appetite with minimal oral intake. Also several episodes of diarrhea. No nausea or vomiting. No shortness of breath. No fever or systemic symptoms. No specific aggravating or  alleviating factors.  In the emergency department afebrile, VSS, no hypoxia. Sodium 132, CMP otherwise unremarkable. WBC 11.2. Platelet count 139. Chest x-ray independently reviewed COPD, left base infiltrate. Concur with radiology interpretation. Hospital Course   Community-acquired pneumonia - Patient with evidence of left base infiltrate, negative blood cultures, cytosis resolved, a febrile, Legionella and strep pneumonia antigen negative, treated with 2 days of IV levofloxacin during hospital stay, will finish another 5 days of oral levofloxacin as an outpatient finish total of 7 days, will be discharged with incentive spirometer, and flutter valve.  Tobacco dependence  Anorexia nervosa/protein calorie malnutrition, severe - Continue supplements  Hypokalemia/hypomagnesemia - Repleted  Discharge Condition: Stable   Follow UP  Follow-up Information    Follow up with BURNETT,BRENT A, MD. Schedule an appointment as soon as possible for a visit in 1 week.   Specialty:  Family Medicine   Why:  Posthospitalization follow-up for pneumonia   Contact information:   [address] [place] [address] [phone]         Discharge Instructions  and  Discharge Medications    Discharge Instructions    Discharge instructions    Complete by:  As directed   Follow with Primary MD BURNETT,BRENT A, MD in 7 days   Get CBC, CMP, 2 view Chest X ray checked  by Primary MD next visit.    Activity: As tolerated with Full fall precautions use walker/cane & assistance as needed   Disposition Home    Diet: Regular, with feeding assistance and aspiration precautions.  For Heart failure patients - Check your Weight same time everyday, if you gain over 2 pounds, or you develop in leg swelling, experience more shortness of breath or chest pain, call your Primary MD immediately. Follow Cardiac Low Salt Diet and 1.5 lit/day fluid restriction.   On your next visit with your  primary care physician please Get Medicines reviewed and adjusted.   Please request your Prim.MD to go over all Hospital Tests and Procedure/Radiological results at the follow up, please get all Hospital records sent to your Prim MD by signing hospital release before you go home.   If you experience worsening of your admission symptoms, develop shortness of breath, life threatening emergency, suicidal or homicidal thoughts you must seek medical attention immediately by calling 911 or calling your MD immediately  if symptoms less severe.  You Must read complete instructions/literature along with all the possible adverse reactions/side effects for all the Medicines you take and that have been prescribed to you. Take any new Medicines after you have completely understood and accpet all the possible adverse reactions/side effects.   Do not drive, operating heavy machinery, perform activities at heights, swimming or participation in water activities or provide baby sitting services if your were admitted for syncope or siezures until you have seen by Primary MD or a Neurologist and advised to do so again.  Do not drive when taking Pain medications.    Do not take more than prescribed Pain, Sleep and Anxiety Medications  Special Instructions: If you have smoked or chewed Tobacco  in the last 2 yrs please stop smoking, stop any regular Alcohol  and or any Recreational drug use.  Wear Seat belts while driving.   Please note  You were cared for by a hospitalist during your hospital stay. If you have any questions about your discharge medications or the care you received while you were in the hospital after you are discharged, you can call the unit and asked to speak with the hospitalist on call if the hospitalist that took care of you is not available. Once you are discharged, your primary care physician will handle any further medical issues. Please note that NO REFILLS for any discharge medications  will be authorized once you are discharged, as it is imperative that you return to your primary care physician (or establish a relationship with a primary care physician if you do not have one) for your aftercare needs so that they can reassess your need for medications and monitor your lab values.     Increase activity slowly    Complete by:  As directed             Medication List    TAKE these medications        acetaminophen-codeine 300-30 MG per tablet  Commonly known as:  TYLENOL #3  Take 1 tablet by mouth every 6 (six) hours as needed for moderate pain.     benzonatate 100 MG capsule  Commonly known as:  TESSALON  Take 1 capsule (100  mg total) by mouth 3 (three) times daily as needed for cough.     Calcium Carbonate-Vitamin D 600-400 MG-UNIT per chew tablet  Chew 1 tablet by mouth daily.     clorazepate 3.75 MG tablet  Commonly known as:  TRANXENE  Take 3.75 mg by mouth 3 (three) times daily as needed for anxiety.     dextromethorphan-guaiFENesin 30-600 MG per 12 hr tablet  Commonly known as:  MUCINEX DM  Take 1 tablet by mouth 2 (two) times daily.     feeding supplement (ENSURE ENLIVE) Liqd  Take 237 mLs by mouth 2 (two) times daily between meals.     levofloxacin 250 MG tablet  Commonly known as:  LEVAQUIN  Take 1 tablet (250 mg total) by mouth daily.     multivitamin-iron-minerals-folic acid chewable tablet  Chew 1 tablet by mouth daily.          Diet and Activity recommendation: See Discharge Instructions above   Consults obtained - none   Major procedures and Radiology Reports - PLEASE review detailed and final reports for all details, in brief -      Dg Chest 2 View  07/21/2014   CLINICAL DATA:  Productive cough, loss of appetite and fatigue since Thursday, diarrhea, congestion, history COPD, hypertension, chronic kidney disease  EXAM: CHEST  2 VIEW  COMPARISON:  09/01/2012  FINDINGS: Normal heart size, mediastinal contours and pulmonary  vascularity.  Atherosclerotic calcification aorta.  Severe emphysematous changes consistent with COPD.  New LEFT lower lobe infiltrate consistent with pneumonia.  Remaining lungs clear.  Minimal LEFT pleural effusion.  No pneumothorax.  Bones demineralized.  Prior cervical spine fusion.  IMPRESSION: COPD changes with LEFT lower lobe infiltrate consistent with pneumonia.   Electronically Signed   By: Lavonia Dana M.D.   On: 07/21/2014 16:08    Micro Results     Recent Results (from the past 240 hour(s))  Culture, respiratory (NON-Expectorated)     Status: None (Preliminary result)   Collection Time: 07/22/14  3:11 PM  Result Value Ref Range Status   Specimen Description SPUTUM  Final   Special Requests NONE  Final   Gram Stain   Final    RARE WBC PRESENT, PREDOMINANTLY PMN RARE SQUAMOUS EPITHELIAL CELLS PRESENT RARE GRAM POSITIVE COCCI IN PAIRS Performed at [facility]    Culture   Final    NORMAL OROPHARYNGEAL FLORA Performed at [facility]    Report Status PENDING  Incomplete  Culture, sputum-assessment     Status: None   Collection Time: 07/22/14  4:01 PM  Result Value Ref Range Status   Specimen Description SPUTUM  Final   Special Requests NONE  Final   Sputum evaluation   Final    THIS SPECIMEN IS ACCEPTABLE. RESPIRATORY CULTURE REPORT TO FOLLOW.   Report Status 07/22/2014 FINAL  Final       Today   Subjective:   [facility] today has no headache,no chest abdominal pain,no new weakness tingling or numbness, feels much better wants to go home today. Reports her cough is much improving, still productive.  Objective:   Blood pressure 135/81, pulse 78, temperature 97.8 F (36.6 C), temperature source Oral, resp. rate 16, height 5' (1.524 m), weight 29.484 kg (65 lb), SpO2 93 %.   Intake/Output Summary (Last 24 hours) at 07/24/14 1021 Last data filed at 07/24/14 0734  Gross per 24 hour  Intake    410 ml  Output   1850 ml  Net  -  1440 ml     Exam   General: cachexic  Cardiovascular: RRR  Respiratory: diminished, no wheezing  Abdomen: S, NT, ND  Ext: no CCE  Data Review   CBC w Diff: Lab Results  Component Value Date   WBC 5.1 07/24/2014   HGB 14.0 07/24/2014   HCT 43.3 07/24/2014   PLT 134* 07/24/2014   LYMPHOPCT 4* 07/21/2014   MONOPCT 12 07/21/2014   EOSPCT 0 07/21/2014   BASOPCT 0 07/21/2014    CMP: Lab Results  Component Value Date   NA 133* 07/24/2014   K 3.9 07/24/2014   CL 100* 07/24/2014   CO2 27 07/24/2014   BUN 8 07/24/2014   CREATININE 0.33* 07/24/2014   CREATININE 0.50 03/18/2014   PROT 7.5 07/21/2014   ALBUMIN 3.9 07/21/2014   BILITOT 0.8 07/21/2014   ALKPHOS 78 07/21/2014   AST 30 07/21/2014   ALT 19 07/21/2014  .   Total Time in preparing paper work, data evaluation and todays exam - 35 minutes  Eliyahu Bille M.D on 07/24/2014 at 10:21 AM  [facility]   Office  [phone]

## 2014-07-25 LAB — CULTURE, RESPIRATORY: CULTURE: NORMAL

## 2014-07-25 LAB — CULTURE, RESPIRATORY W GRAM STAIN

## 2014-08-12 ENCOUNTER — Other Ambulatory Visit: Payer: Self-pay

## 2014-08-22 ENCOUNTER — Other Ambulatory Visit: Payer: Self-pay | Admitting: Physician Assistant

## 2014-08-22 DIAGNOSIS — J189 Pneumonia, unspecified organism: Secondary | ICD-10-CM

## 2014-08-22 DIAGNOSIS — R9389 Abnormal findings on diagnostic imaging of other specified body structures: Secondary | ICD-10-CM

## 2014-08-23 ENCOUNTER — Inpatient Hospital Stay
Admission: RE | Admit: 2014-08-23 | Discharge: 2014-08-23 | Disposition: A | Discharge status: Home / Self Care | Payer: Medicare Other | Source: Ambulatory Visit | Attending: Physician Assistant | Admitting: Physician Assistant

## 2014-08-23 ENCOUNTER — Ambulatory Visit
Admission: RE | Admit: 2014-08-23 | Discharge: 2014-08-23 | Disposition: A | Discharge status: Home / Self Care | Payer: Medicare Other | Source: Ambulatory Visit | Attending: Physician Assistant | Admitting: Physician Assistant

## 2014-08-23 DIAGNOSIS — J189 Pneumonia, unspecified organism: Secondary | ICD-10-CM

## 2014-08-23 DIAGNOSIS — R9389 Abnormal findings on diagnostic imaging of other specified body structures: Secondary | ICD-10-CM

## 2014-08-29 ENCOUNTER — Encounter: Payer: Self-pay | Admitting: Internal Medicine

## 2014-08-29 ENCOUNTER — Ambulatory Visit (INDEPENDENT_AMBULATORY_CARE_PROVIDER_SITE_OTHER)
Admission: RE | Admit: 2014-08-29 | Discharge: 2014-08-29 | Disposition: A | Discharge status: Home / Self Care | Payer: Medicare Other | Source: Ambulatory Visit | Attending: Internal Medicine | Admitting: Internal Medicine

## 2014-08-29 ENCOUNTER — Ambulatory Visit (INDEPENDENT_AMBULATORY_CARE_PROVIDER_SITE_OTHER): Payer: Medicare Other | Admitting: Internal Medicine

## 2014-08-29 VITALS — BP 104/70 | HR 100 | Ht 60.0 in | Wt <= 1120 oz

## 2014-08-29 DIAGNOSIS — Z23 Encounter for immunization: Secondary | ICD-10-CM

## 2014-08-29 DIAGNOSIS — J449 Chronic obstructive pulmonary disease, unspecified: Secondary | ICD-10-CM | POA: Diagnosis not present

## 2014-08-29 DIAGNOSIS — J189 Pneumonia, unspecified organism: Secondary | ICD-10-CM | POA: Diagnosis not present

## 2014-08-29 DIAGNOSIS — Z720 Tobacco use: Secondary | ICD-10-CM | POA: Diagnosis not present

## 2014-08-29 DIAGNOSIS — F17210 Nicotine dependence, cigarettes, uncomplicated: Secondary | ICD-10-CM

## 2014-08-29 DIAGNOSIS — E43 Unspecified severe protein-calorie malnutrition: Secondary | ICD-10-CM

## 2014-08-29 MED ORDER — FLUTTER DEVI: Status: DC

## 2014-08-29 NOTE — Patient Instructions (Addendum)
Prevnar 13 today   Please remember to go to the  x-ray department downstairs for your tests - we will call you with the results when they are available.  For cough we recommend mucinex up to 1200 mg every 12hours and use the flutter as much as you can  Please schedule a follow up office visit in 6 weeks, call sooner if needed pfts

## 2014-08-29 NOTE — Progress Notes (Signed)
Subjective:    Patient ID: Regina [name], female    DOB: 03-08-1945  MRN: [id number]  HPI  40 yowf active smoker previously worked at [employer] office with clinical evidence of severe copd  s/p admit  Admission date: 07/21/2014 Discharge Date: 07/24/2014  Principal Problem:  CAP (community acquired pneumonia)   COPD (chronic obstructive pulmonary disease)  Tobacco dependence  Anorexia nervosa  Protein-calorie malnutrition, severe  Community acquired pneumonia   08/29/2014 1st [facility] office visit/ Regina [name]  - not on maint  Chief Complaint  Patient presents with  . Pulmonary Consult    Referred by Dr. Juanita Craver. Pt c/o cough for the past month- prod with clear sputum.   best days can still a [employer] using HC and leaning on cart off 02 - extremely slow pace due to chronic back pain  Some coughing in am's green mucus few minutes only doesn't disturb sleep /still on doxy Not interested in stopping smoking/ has not found inhalers helpful in past   No obvious other patterns in day to day or daytime variabilty or assoc   cp or chest tightness, subjective wheeze overt sinus or hb symptoms. No unusual exp hx or h/o childhood pna/ asthma or knowledge of premature birth.  Sleeping ok without nocturnal  or early am exacerbation  of respiratory  c/o's or need for noct saba. Also denies any obvious fluctuation of symptoms with weather or environmental changes or other aggravating or alleviating factors except as outlined above   Current Medications, Allergies, Complete Past Medical History, Past Surgical History, Family History, and Social History were reviewed in [employer] record.           Review of Systems  Constitutional: Negative for fever, chills and unexpected weight change.  HENT: Negative for congestion, dental problem, ear pain, nosebleeds, postnasal drip, rhinorrhea, sinus pressure, sneezing, sore throat, trouble swallowing and voice change.    Eyes: Negative for visual disturbance.  Respiratory: Positive for cough. Negative for choking and shortness of breath.   Cardiovascular: Negative for chest pain and leg swelling.  Gastrointestinal: Negative for vomiting, abdominal pain and diarrhea.  Genitourinary: Negative for difficulty urinating.  Musculoskeletal: Negative for arthralgias.  Skin: Negative for rash.  Neurological: Negative for tremors, syncope and headaches.  Hematological: Does not bruise/bleed easily.       Objective:   Physical Exam   Emaciated chronically ill wf >> stated age walks at snail's pace with walker   Wt Readings from Last 3 Encounters:  08/29/14 59 lb (26.762 kg)  07/21/14 65 lb (29.484 kg)  03/19/14 65 lb (29.484 kg)    Vital signs reviewed   HEENT: nl dentition, turbinates, and orophanx. Nl external ear canals without cough reflex   NECK :  without JVD/Nodes/TM/ nl carotid upstrokes bilaterally   LUNGS: no acc muscle use, distant bs with congested sounding cough but no wheeze    CV:  RRR  no s3 or murmur or increase in P2, no edema   ABD:  soft and nontender with nl excursion in the supine position. No bruits or organomegaly, bowel sounds nl  MS:  Diffusely wasted muscles/ joints without deformities, no calf tenderness, cyanosis or clubbing  SKIN: warm and dry without lesions    NEURO:  alert, approp, no deficits    CXR PA and Lateral:   08/29/2014 :     I personally reviewed images and agree with radiology impression as follows:    Hyperinflation again noted. Persistent bilateral  lower lobe infiltrate/pneumonia in left greater than right. No pulmonary edema. - reviewed also CT chest 08/23/14 shows same thing       Assessment & Plan:

## 2014-08-30 ENCOUNTER — Encounter: Payer: Self-pay | Admitting: Internal Medicine

## 2014-08-30 NOTE — Assessment & Plan Note (Signed)
This is longstanding and clearly the greatest threat to her health in the long run, not the copd > defer rx/f/u to Dr Tollie Pizza.

## 2014-08-30 NOTE — Assessment & Plan Note (Signed)

## 2014-08-30 NOTE — Progress Notes (Signed)
Quick Note:  LMTCB ______ 

## 2014-08-30 NOTE — Assessment & Plan Note (Addendum)
She appears to have quite advance dz but not really bothered by airflow obstruction due to very sedentary lifestyle  Main issues are continued smoking (see sep a/p) and poor mc function > try mucinex/ flutter valve ? Vest candidate at some point as she is borderline hypoxemic at this point likely from v/q mismatching due to retained secretions  I had an extended discussion with the patient reviewing all relevant studies completed to date x 39m    Each maintenance medication was reviewed in detail including most importantly the difference between maintenance and prns and under what circumstances the prns are to be triggered using an action plan format that is not reflected in the computer generated alphabetically organized AVS.    Please see instructions for details which were reviewed in writing and the patient given a copy highlighting the part that I personally wrote and discussed at today's ov.

## 2014-08-30 NOTE — Assessment & Plan Note (Addendum)
She may have significant bronchiectasis in L > R LL but would need HRCT to prove it - certainly dates back on the Left to 2014 at least and very unlikely any form of neoplasm  Completing out pt abx, nothing further needed for now, recheck at next ov

## 2014-09-02 NOTE — Progress Notes (Signed)
Quick Note:  lmtcb ______ 

## 2014-09-06 ENCOUNTER — Institutional Professional Consult (permissible substitution): Payer: Medicare Other | Admitting: Internal Medicine

## 2014-09-09 ENCOUNTER — Telehealth: Payer: Self-pay | Admitting: Internal Medicine

## 2014-09-09 NOTE — Telephone Encounter (Signed)
Called spoke with pt. She had received an old message and needed nothing further

## 2014-10-14 ENCOUNTER — Ambulatory Visit (INDEPENDENT_AMBULATORY_CARE_PROVIDER_SITE_OTHER)
Admission: RE | Admit: 2014-10-14 | Discharge: 2014-10-14 | Disposition: A | Discharge status: Home / Self Care | Payer: Medicare Other | Source: Ambulatory Visit | Attending: Internal Medicine | Admitting: Internal Medicine

## 2014-10-14 ENCOUNTER — Encounter: Payer: Self-pay | Admitting: Internal Medicine

## 2014-10-14 ENCOUNTER — Ambulatory Visit (INDEPENDENT_AMBULATORY_CARE_PROVIDER_SITE_OTHER): Payer: Medicare Other | Admitting: Internal Medicine

## 2014-10-14 VITALS — BP 138/84 | HR 60 | Ht 59.5 in | Wt <= 1120 oz

## 2014-10-14 DIAGNOSIS — J449 Chronic obstructive pulmonary disease, unspecified: Secondary | ICD-10-CM

## 2014-10-14 DIAGNOSIS — F17210 Nicotine dependence, cigarettes, uncomplicated: Secondary | ICD-10-CM | POA: Diagnosis not present

## 2014-10-14 DIAGNOSIS — Z720 Tobacco use: Secondary | ICD-10-CM | POA: Diagnosis not present

## 2014-10-14 LAB — PULMONARY FUNCTION TEST
DL/VA % PRED: 45 %
DL/VA: 1.89 ml/min/mmHg/L
DLCO unc % pred: 26 %
DLCO unc: 4.85 ml/min/mmHg
FEF 25-75 POST: 1.57 L/s
FEF 25-75 Pre: 1.57 L/sec
FEF2575-%CHANGE-POST: 0 %
FEF2575-%PRED-POST: 93 %
FEF2575-%PRED-PRE: 94 %
FEV1-%Change-Post: -3 %
FEV1-%Pred-Post: 63 %
FEV1-%Pred-Pre: 66 %
FEV1-Post: 1.2 L
FEV1-Pre: 1.25 L
FEV1FVC-%CHANGE-POST: -1 %
FEV1FVC-%Pred-Pre: 122 %
FEV6-%Change-Post: 0 %
FEV6-%Pred-Post: 55 %
FEV6-%Pred-Pre: 55 %
FEV6-Post: 1.3 L
FEV6-Pre: 1.32 L
FEV6FVC-%Pred-Post: 105 %
FEV6FVC-%Pred-Pre: 105 %
FVC-%Change-Post: -2 %
FVC-%Pred-Post: 52 %
FVC-%Pred-Pre: 53 %
FVC-Post: 1.3 L
FVC-Pre: 1.34 L
POST FEV1/FVC RATIO: 92 %
POST FEV6/FVC RATIO: 100 %
PRE FEV1/FVC RATIO: 93 %
Pre FEV6/FVC Ratio: 100 %
RV % pred: 114 %
RV: 2.24 L
TLC % PRED: 80 %
TLC: 3.53 L

## 2014-10-14 NOTE — Progress Notes (Signed)
PFT done today. 

## 2014-10-14 NOTE — Assessment & Plan Note (Signed)

## 2014-10-14 NOTE — Progress Notes (Signed)
Subjective:    Patient ID: Regina [name], female    DOB: Dec 15, 1945  MRN: [id number]  HPI  63 yowf active smoker previously worked at [employer] office with clinical evidence of copd but nl airflow on pfts 10/14/2014 so considered COPD GOLD 0   Admission date: 07/21/2014 Discharge Date: 07/24/2014  Principal Problem:  CAP (community acquired pneumonia)   COPD (chronic obstructive pulmonary disease)  Tobacco dependence  Anorexia nervosa  Protein-calorie malnutrition, severe  Community acquired pneumonia   08/29/2014 1st [facility] office visit/ Jannett Schmall  - not on maint  Chief Complaint  Patient presents with  . Pulmonary Consult    Referred by Dr. Juanita Craver. Pt c/o cough for the past month- prod with clear sputum.   best days can still a [employer] using HC and leaning on cart off 02 - extremely slow pace due to chronic back pain  Some coughing in am's green mucus few minutes only doesn't disturb sleep /still on doxy Not interested in stopping smoking/ has not found inhalers helpful in past  rec Prevnar 13 today  Please remember to go to the  x-ray department downstairs for your tests - we will call you with the results when they are available. For cough we recommend mucinex up to 1200 mg every 12hours and use the flutter as much as you can   10/14/2014 f/u ov/Dellis Voght re: GOLD 0 still smoking Chief Complaint  Patient presents with  . Follow-up    PFT done today. Cough has improved some. No new co's today.       most coughing x one hour each am > white/ frothy - very sedentary/ slow pace s limiting sob    No obvious day to day or daytime variability or assoc chronic  cp or chest tightness, subjective wheeze or overt sinus or hb symptoms. No unusual exp hx or h/o childhood pna/ asthma or knowledge of premature birth.  Sleeping ok without nocturnal  or early am exacerbation  of respiratory  c/o's or need for noct saba. Also denies any obvious fluctuation of symptoms with  weather or environmental changes or other aggravating or alleviating factors except as outlined above   Current Medications, Allergies, Complete Past Medical History, Past Surgical History, Family History, and Social History were reviewed in [employer] record.  ROS  The following are not active complaints unless bolded sore throat, dysphagia, dental problems, itching, sneezing,  nasal congestion or excess/ purulent secretions, ear ache,   fever, chills, sweats, unintended wt loss, classically pleuritic or exertional cp, hemoptysis,  orthopnea pnd or leg swelling, presyncope, palpitations, abdominal pain, anorexia, nausea, vomiting, diarrhea  or change in bowel or bladder habits, change in stools or urine, dysuria,hematuria,  rash, arthralgias, visual complaints, headache, numbness, weakness or ataxia or problems with walking or coordination,  change in mood/affect or memory.                   Objective:   Physical Exam   Emaciated chronically ill wf    10/14/2014         61  Wt Readings from Last 3 Encounters:  08/29/14 59 lb (26.762 kg)  07/21/14 65 lb (29.484 kg)  03/19/14 65 lb (29.484 kg)    Vital signs reviewed   HEENT: nl dentition, turbinates, and orophanx. Nl external ear canals without cough reflex   NECK :  without JVD/Nodes/TM/ nl carotid upstrokes bilaterally   LUNGS: no acc muscle use, distant bs with congested sounding  cough but no wheeze    CV:  RRR  no s3 or murmur or increase in P2, no edema   ABD:  soft and nontender with nl excursion in the supine position. No bruits or organomegaly, bowel sounds nl  MS:  Diffusely wasted muscles/ joints without deformities, no calf tenderness, cyanosis or clubbing  SKIN: warm and dry without lesions    NEURO:  alert, approp, no deficits     CXR PA and Lateral:   10/14/2014 :     I personally reviewed images and agree with radiology impression as follows:    COPD with bibasilar densities that  have improved since the earlier study      Assessment & Plan:

## 2014-10-14 NOTE — Patient Instructions (Signed)
The key is to stop smoking completely before smoking completely stops you!   Please remember to go to the x-ray department downstairs for your tests - we will call you with the results when they are available.     If you are satisfied with your treatment plan,  let your doctor know and he/she can either refill your medications or you can return here when your prescription runs out.     If in any way you are not 100% satisfied,  please tell us.  If 100% better, tell your friends!  Pulmonary follow up is as needed

## 2014-10-14 NOTE — Assessment & Plan Note (Addendum)
-    PFT's  10/14/2014  FEV1 1.20 (63 % ) ratio 92  p no % improvement from saba with DLCO  26 % corrects to 45 % for alv volume    I had an extended final summary discussion with the patient reviewing all relevant studies completed to date and  lasting 15 to 20 minutes of a 25 minute visit on the following issues:    So she is at risk for copd and has CB symptoms (for which flutter valve/mucinex already rec) but not enough airflow obst to meet the criteria for copd and no studies to support any intervention in this subset of patients except  to focus for now just on d/c cigs.  I had an extended discussion with the patient reviewing all relevant studies completed to date and  lasting 15 to 20 minutes of a 25 minute visit    Each maintenance medication was reviewed in detail including most importantly the difference between maintenance and prns and under what circumstances the prns are to be triggered using an action plan format that is not reflected in the computer generated alphabetically organized AVS.    Please see instructions for details which were reviewed in writing and the patient given a copy highlighting the part that I personally wrote and discussed at today's ov.

## 2014-10-15 NOTE — Progress Notes (Signed)
Quick Note:  Spoke with pt and notified of results per Dr. Wert. Pt verbalized understanding and denied any questions.  ______ 

## 2015-02-12 ENCOUNTER — Other Ambulatory Visit: Payer: Self-pay | Admitting: Thoracic Surgery (Cardiothoracic Vascular Surgery)

## 2015-02-12 DIAGNOSIS — I7121 Aneurysm of the ascending aorta, without rupture: Secondary | ICD-10-CM

## 2015-02-12 DIAGNOSIS — I712 Thoracic aortic aneurysm, without rupture: Secondary | ICD-10-CM

## 2015-02-13 ENCOUNTER — Other Ambulatory Visit: Payer: Self-pay | Admitting: Thoracic Surgery (Cardiothoracic Vascular Surgery)

## 2015-02-13 DIAGNOSIS — I712 Thoracic aortic aneurysm, without rupture: Secondary | ICD-10-CM

## 2015-02-13 DIAGNOSIS — I7121 Aneurysm of the ascending aorta, without rupture: Secondary | ICD-10-CM

## 2015-03-07 LAB — CREATININE, ISTAT: Creatinine, IStat: 0.6 mg/dL (ref 0.6–1.3)

## 2015-03-11 ENCOUNTER — Encounter: Payer: Self-pay | Admitting: Thoracic Surgery (Cardiothoracic Vascular Surgery)

## 2015-03-11 ENCOUNTER — Ambulatory Visit (INDEPENDENT_AMBULATORY_CARE_PROVIDER_SITE_OTHER): Payer: Medicare Other | Admitting: Thoracic Surgery (Cardiothoracic Vascular Surgery)

## 2015-03-11 ENCOUNTER — Ambulatory Visit
Admission: RE | Admit: 2015-03-11 | Discharge: 2015-03-11 | Disposition: A | Discharge status: Home / Self Care | Payer: Medicare Other | Source: Ambulatory Visit | Attending: Thoracic Surgery (Cardiothoracic Vascular Surgery) | Admitting: Thoracic Surgery (Cardiothoracic Vascular Surgery)

## 2015-03-11 VITALS — BP 114/79 | HR 70 | Resp 16 | Ht 60.0 in | Wt <= 1120 oz

## 2015-03-11 DIAGNOSIS — I712 Thoracic aortic aneurysm, without rupture: Secondary | ICD-10-CM | POA: Diagnosis not present

## 2015-03-11 DIAGNOSIS — I7121 Aneurysm of the ascending aorta, without rupture: Secondary | ICD-10-CM

## 2015-03-11 MED ORDER — GADOBENATE DIMEGLUMINE 529 MG/ML IV SOLN
5.0000 mL | Freq: Once | INTRAVENOUS | Status: AC | PRN
  Administered 2015-03-11: 5 mL via INTRAVENOUS

## 2015-03-11 NOTE — Progress Notes (Signed)
[address]       [place] [address]             [phone]       HPI: Regina [name] returns today for an annual follow-up visit.  She is a 70 year old woman who was first found to have an ascending aortic aneurysm in 2013. It measured 4 cm on the CT at that time. We have been following her on a yearly basis since then. Most recently measured 3.9 cm on MR angiogram a year ago.  In the interim since the last visit she had pneumonia. A CT in July showed the pneumonia. Her ascending aorta was unchanged on that scan.  Her primary issue continues to be cervical spondylosis with myelopathy and muscle wasting. She suffers from anorexia and severe protein calorie malnutrition. Her appetite remains poor. She has not been having chest pain.  Past Medical History  Diagnosis Date  . Hypertension   . Hyponatremia   . Ascending aortic aneurysm ([place]) 08/23/2011  . COPD (chronic obstructive pulmonary disease) ([place]) 08/23/2011  . Cervical spondylosis with myelopathy   . Occipital neuralgia   . Arthritis   . Chronic kidney disease     urine incontinence      Current Outpatient Prescriptions  Medication Sig Dispense Refill  . acetaminophen-codeine (TYLENOL #3) 300-30 MG per tablet Take 1 tablet by mouth every 6 (six) hours as needed for moderate pain.     . benzonatate (TESSALON) 100 MG capsule Take 1 capsule (100 mg total) by mouth 3 (three) times daily as needed for cough. 20 capsule 0  . Calcium Carbonate-Vitamin D 600-400 MG-UNIT per chew tablet Chew 1 tablet by mouth daily.    . clorazepate (TRANXENE) 3.75 MG tablet Take 3.75 mg by mouth 3 (three) times daily as needed for anxiety.     Marland Kitchen dextromethorphan-guaiFENesin (MUCINEX DM) 30-600 MG per 12 hr tablet Take 1 tablet by mouth 2 (two) times daily. (Patient taking differently: Take 1 tablet by mouth daily. ) 10 tablet 0  . feeding supplement, ENSURE ENLIVE, (ENSURE ENLIVE) LIQD Take 237 mLs by mouth 2 (two) times daily  between meals. 237 mL 12  . multivitamin-iron-minerals-folic acid (CENTRUM) chewable tablet Chew 1 tablet by mouth daily.    Marland Kitchen Respiratory Therapy Supplies (FLUTTER) DEVI Use as directed 1 each 0   No current facility-administered medications for this visit.    Physical Exam BP 114/79 mmHg  Pulse 70  Resp 16  Ht 5' (1.524 m)  Wt 65 lb (29.484 kg)  BMI 12.69 kg/m2  SpO2 92% Cachectic 70 year old woman in no acute distress Alert and oriented 3, generally weak with no focal differences Cardiac regular rate and rhythm normal S1 and S2, no rubs or murmurs No carotid bruits Severe muscle wasting  Diagnostic Tests: MRA CHEST WITH OR WITHOUT CONTRAST  TECHNIQUE: Angiographic images of the chest were obtained using MRA technique without and with intravenous contrast.  CONTRAST: 27mL MULTIHANCE GADOBENATE DIMEGLUMINE 529 MG/ML IV SOLN  COMPARISON: CT 08/23/2014  FINDINGS: Fusiform ectatic ascending aorta with transverse maximum diameters as follows:  3.5 cm sino-tubular junction  3.8 cm mid ascending (stable by my measurement since 08/23/2014 )  3.2 cm distal ascending/ proximal arch  2.6 cm distal arch  2.3 cm proximal descending  2.2 cm distal descending just above the diaphragm  Variant brachiocephalic arterial origin anatomy with the left vertebral artery arising directly from the aortic arch. No evidence of significant proximal branches  stenosis. No aortic dissection or significant atheromatous irregularity.  No pleural or pericardial effusion. No hilar or mediastinal adenopathy. No pleural or pericardial effusion.  IMPRESSION: 1. Ectatic ascending aorta and proximal arch as detailed above. Recommend annual imaging followup by CTA or MRA. This recommendation follows 2010 ACCF/AHA/AATS/ACR/ASA/SCA/SCAI/SIR/STS/SVM Guidelines for the Diagnosis and Management of Patients with Thoracic Aortic Disease. Circulation.2010; 121LA:3938873   Electronically Signed  By: Lucrezia Europe M.D.  On: 03/11/2015 13:48  I personally reviewed the MR injury of the chest and concur with the findings as noted above. Her ascending aneurysm is unchanged dating back to 2013.  Impression: 70 year old woman with multiple medical problems the most serious of which is cervical spondylosis with myelopathy and severe muscle wasting. She also service from anorexia and protein calorie malnutrition.   She was first found to have an ascending aortic aneurysm in 2013. It remains unchanged since that time. There is no indication for surgery at the present time, but she does need continued follow-up. It is unclear if she would be a surgical candidate due to her other medical issues.  Plan: Return in one year with MRA angio of chest  I spent 10 minutes with Regina [name] during this visit, greater than 50% spent in counseling.  Melrose Nakayama, MD [facility] [phone]

## 2015-08-24 DIAGNOSIS — I272 Pulmonary hypertension, unspecified: Secondary | ICD-10-CM | POA: Diagnosis present

## 2015-08-24 NOTE — H&P (Addendum)
OFFICE VISIT NOTES COPIED TO EPIC FOR DOCUMENTATION   History of Present Illness (Bridgette Ebony Hail AGNP-C; September 21, 2015 5:00 PM) The patient is a 70 year old female, accompanied by her sister and husband during the visit, who presents for a follow-up for Edema. She was seen by her PCP on 07/23/2015 for worsening lower leg edema over a period of 2-3 weeks. Also reports worsening shortness of breath. BNP minimally elevated at 188. She has a history of hypertension and tobacco use disorder. No history of diabetes or hyperlipidemia. Denies any PND, orthopnea, dizziness, syncope, or symptoms suggestive of claudication or TIA.   Problem List/Past Medical Anderson Malta Sergeant; Sep 21, 2015 11:23 AM) Lower leg edema (R60.0)  Shortness of breath (R06.02)  Tobacco use disorder (F17.200)  Acute on chronic diastolic heart failure (Q000111Q)  Severely underweight adult (R63.6)  Benign essential hypertension (I10)   Allergies Anderson Malta Sergeant; 09-21-2015 11:23 AM) ERYTHROMYCIN  Rash.  Family History Anderson Malta Sergeant; 09/21/2015 11:23 AM) Mother  Deceased. at age 32 from stroke complications; no prior strokes or heart attacks, no other cardiovascular conditions Father  Deceased. at age 82 from MI; no prior MI or strokes, no other known cardiovascular conditions Sister 1  In stable health. 5 yrs younger; no cardiovascular conditions  Social History Anderson Malta Sergeant; 09/21/15 11:23 AM) Marital status  Married. Current tobacco use  Current every day smoker. 1 ppd Alcohol Use  glass of wine per night Living Situation  Lives with spouse. Number of Children  1.  Past Surgical History Anderson Malta Sergeant; 09-21-15 11:23 AM) Cholecystectomy 1989 Spinal Fusion - Neck 2003  Medication History Anderson Malta Sergeant; 09/21/15 11:30 AM) Spironolactone (25MG  Tablet, 1 (one) Tablet Oral two times daily, Taken starting 08/01/2015) Active. Multivitamin Adult (1 Oral daily) Active. Tranxene-T (3.75MG   Tablet, 1 Oral as needed) Active. Tylenol with Codeine #3 (300-30MG  Tablet, 1 Oral as needed) Active. Mucinex (600MG  Tablet ER, 1 Oral daily) Active. Medications Reconciled (verbally)  Diagnostic Studies History Neldon Labella, AGNP-C; 09/21/15 4:50 PM) Echocardiogram 08/14/2015 Left ventricle cavity is normal in size. Mild concentric hypertrophy of the left ventricle. Abnormal septal wall motion due to right ventricular volume overload with D- shaped septum in diastole. Normal diastolic filling pattern. Calculated EF 69%. Right atrial cavity is moderately dilated. Right ventricle cavity is mild to moderately dilated. Normal right ventricular function. Mild mitral valve leaflet thickening. Borderline prolapse of the mitral valve leaflets. Trace mitral regurgitation. Moderate tricuspid regurgitation. Mild pulmonary hypertension. Pulmonary artery systolic pressure is estimated at 37 mm Hg. Mild pulmonic regurgitation. Nuclear stress test 08/08/2015 1. The resting electrocardiogram demonstrated normal sinus rhythm and normal resting conduction. Poor R wave progression. Stress EKG is non diagnostic for ischemia as it is a pharmacologic stress using Lexiscan. Stress symptoms included dyspnea. 2. Myocardial perfusion imaging is normal. Overall left ventricular systolic function was normal without regional wall motion abnormalities. The left ventricular ejection fraction was 76%. Labwork  09/21/15: Creatinine 0.54, sodium 130, potassium 4.4, CBC normal, PT/INR normal 08/11/2015: Creatinine 0.5, potassium 4.4, sodium 131 07/23/2015: BNP 188, creatinine 0.58, potassium 4.2, sodium 134, CBC normal Endoscopy 2011 MRI, Chest 03/11/2015 Ectatic ascending aorta and proximal arch    Review of Systems (Bridgette Fisher, AGNP-C; 09/21/15 5:0 PM) General Not Present- Anorexia, Fatigue and Fever. Respiratory Present- Cough and Difficulty Breathing on Exertion. Not Present- Decreased Exercise  Tolerance. Cardiovascular Present- Edema. Not Present- Chest Pain, Claudications, Orthopnea, Palpitations and Paroxysmal Nocturnal Dyspnea. Gastrointestinal Not Present- Black, Tarry Stool, Change in Bowel Habits and Nausea. Neurological Not Present- Focal Neurological  Symptoms and Syncope. Endocrine Not Present- Cold Intolerance, Excessive Sweating, Heat Intolerance and Thyroid Problems. Hematology Not Present- Anemia, Easy Bruising, Petechiae and Prolonged Bleeding.  Vitals Anderson Malta Sergeant; 08/22/2015 11:35 AM) 08/22/2015 11:23 AM Weight: 64.38 lb Height: 60in Body Surface Area: 1.15 m Body Mass Index: 12.57 kg/m  BP: 110/60 (Sitting, Left Arm, Standard)       Physical Exam (Bridgette Allison AGNP-C; 08/22/2015 5:00 PM) General Mental Status-Alert. General Appearance-Cooperative, Appears stated age, Not in acute distress. Build & Nutrition-Moderately built.  Head and Neck Thyroid Gland Characteristics - no palpable nodules, no palpable enlargement.  Chest and Lung Exam Palpation Tender - No chest wall tenderness. Auscultation Breath sounds - Clear.  Cardiovascular Inspection Jugular vein - Right - No Distention. Auscultation Heart Sounds - S1 WNL, S2 WNL and No gallop present. Murmurs & Other Heart Sounds - Murmur - No murmur.  Abdomen Palpation/Percussion Normal exam - Non Tender and No hepatosplenomegaly. Auscultation Normal exam - Bowel sounds normal.  Peripheral Vascular Lower Extremity Inspection - Bilateral - Erythematous. Palpation - Edema - Bilateral - 3+ Pitting edema. Femoral pulse - Bilateral - Normal. Popliteal pulse - Bilateral - Normal. Dorsalis pedis pulse - Bilateral - Normal. Posterior tibial pulse - Bilateral - Normal. Carotid arteries - Bilateral-No Carotid bruit. Abdomen-No prominent abdominal aortic pulsation, No epigastric bruit.  Neurologic Neurologic evaluation reveals -alert and oriented x 3 with no impairment of recent  or remote memory. Motor-Grossly intact without any focal deficits.  Musculoskeletal Global Assessment Left Lower Extremity - normal range of motion without pain. Right Lower Extremity - normal range of motion without pain.  Assessment & Plan (Bridgette Ebony Hail AGNP-C; 08/22/2015 4:59 PM) Shortness of breath (R06.02)  Echocardiogram 08/14/2015:  Left ventricle cavity is normal in size. Mild concentric hypertrophy of the left ventricle. Abnormal septal wall motion due to right ventricular volume overload with D- shaped septum in diastole. Normal diastolic filling pattern. Calculated EF 69%. Right atrial cavity is moderately dilated. Right ventricle cavity is mild to moderately dilated. Normal right ventricular function. Mild mitral valve leaflet thickening. Borderline prolapse of the mitral valve leaflets. Trace mitral regurgitation. Moderate tricuspid regurgitation. Mild pulmonary hypertension. Pulmonary artery systolic pressure is estimated at 37 mm Hg. Mild pulmonic regurgitation.  Nuclear stress test 08/08/2015:  1. The resting electrocardiogram demonstrated normal sinus rhythm and normal resting conduction. Poor R wave progression. Stress EKG is non diagnostic for ischemia as it is a pharmacologic stress using Lexiscan. Stress symptoms included dyspnea. 2. Myocardial perfusion imaging is normal. Overall left ventricular systolic function was normal without regional wall motion abnormalities. The left ventricular ejection fraction was 76%.  EKG 08/01/2015: Sinus rhythm at a rate of 60 bpm, normal axis, P pulmonale, poor R-wave progression, cannot exclude anterior infarct old. No evidence of ischemia. Current Plans METABOLIC PANEL, BASIC (99991111) CBC & PLATELETS (AUTO) [id number]) Continued Spironolactone 25MG , 1 (one) Tablet daily, #30, 08/22/2015, Ref. x1. Started Isosorbide Mononitrate ER 30MG , 1 (one) Tablet daily, #30, 08/22/2015, Ref. x1. Started Torsemide 20MG , 1 (one) Tablet daily, #30,  08/22/2015, No Refill. Local Order: daily until edema improves then PRN  Labwork  08/22/2015: Creatinine 0.54, sodium 130, potassium 4.4, CBC normal, PT/INR normal 08/11/2015: Creatinine 0.5, potassium 4.4, sodium 131 07/23/2015: BNP 188, creatinine 0.58, potassium 4.2, sodium 134, CBC normal  Acute on chronic diastolic heart failure (Q000111Q) Lower leg edema (R60.0) Current Plans PT (PROTHROMBIN TIME) (16109) Tobacco use disorder (F17.200) Severely underweight adult (R63.6) Benign essential hypertension (I10)  Current Plans Mechanism of underlying disease  process and action of medications discussed with the patient. I discussed primary/secondary prevention and also dietary counseling was done. She presents for a follow-up echocardiogram and nuclear stress test due to worsening dyspnea and edema. Nuclear stress test was negative for evidence of ischemia. Echocardiogram revealed RV volume overload, consistent with acute on chronic cor pulmonale. Edema has not changed significantly with increased dose of spironolactone. We'll decrease this back to 25 mg daily and add torsemide 20 mg daily and isosorbide 30 mg daily. If edema begins to improve, she was advised to only take torsemide on an as-needed basis. We'll also schedule for right heart catheterization to adequately measure right heart pressures. Follow up after catheterization for reevaluation and further recommendations.  *I have discussed this case with Dr. Einar Gip and he personally examined the patient and participated in formulating the plan.*  Signed by Neldon Labella, AGNP-C (08/22/2015 5:00 PM)

## 2015-08-29 ENCOUNTER — Encounter (HOSPITAL_COMMUNITY)
Admission: RE | Disposition: A | Discharge status: Home / Self Care | Payer: Self-pay | Source: Ambulatory Visit | Attending: Cardiology

## 2015-08-29 ENCOUNTER — Encounter (HOSPITAL_COMMUNITY): Payer: Self-pay | Admitting: Cardiology

## 2015-08-29 ENCOUNTER — Ambulatory Visit (HOSPITAL_COMMUNITY)
Admission: RE | Admit: 2015-08-29 | Discharge: 2015-08-29 | Disposition: A | Discharge status: Home / Self Care | Payer: Medicare Other | Source: Ambulatory Visit | Attending: Cardiology | Admitting: Cardiology

## 2015-08-29 DIAGNOSIS — I110 Hypertensive heart disease with heart failure: Secondary | ICD-10-CM | POA: Insufficient documentation

## 2015-08-29 DIAGNOSIS — Z981 Arthrodesis status: Secondary | ICD-10-CM | POA: Insufficient documentation

## 2015-08-29 DIAGNOSIS — R636 Underweight: Secondary | ICD-10-CM | POA: Insufficient documentation

## 2015-08-29 DIAGNOSIS — Z681 Body mass index (BMI) 19 or less, adult: Secondary | ICD-10-CM | POA: Diagnosis not present

## 2015-08-29 DIAGNOSIS — I2781 Cor pulmonale (chronic): Secondary | ICD-10-CM | POA: Insufficient documentation

## 2015-08-29 DIAGNOSIS — F17210 Nicotine dependence, cigarettes, uncomplicated: Secondary | ICD-10-CM | POA: Diagnosis not present

## 2015-08-29 DIAGNOSIS — Z823 Family history of stroke: Secondary | ICD-10-CM | POA: Insufficient documentation

## 2015-08-29 DIAGNOSIS — I5033 Acute on chronic diastolic (congestive) heart failure: Secondary | ICD-10-CM | POA: Diagnosis not present

## 2015-08-29 DIAGNOSIS — Z8249 Family history of ischemic heart disease and other diseases of the circulatory system: Secondary | ICD-10-CM | POA: Diagnosis not present

## 2015-08-29 DIAGNOSIS — I272 Other secondary pulmonary hypertension: Secondary | ICD-10-CM | POA: Diagnosis not present

## 2015-08-29 HISTORY — PX: CARDIAC CATHETERIZATION: SHX172

## 2015-08-29 LAB — POCT I-STAT 3, VENOUS BLOOD GAS (G3P V)
Acid-Base Excess: 5 mmol/L — ABNORMAL HIGH (ref 0.0–2.0)
Bicarbonate: 31.4 mEq/L — ABNORMAL HIGH (ref 20.0–24.0)
O2 Saturation: 51 %
PCO2 VEN: 50.2 mmHg — AB (ref 45.0–50.0)
TCO2: 33 mmol/L (ref 0–100)
pH, Ven: 7.405 — ABNORMAL HIGH (ref 7.250–7.300)
pO2, Ven: 28 mmHg — ABNORMAL LOW (ref 31.0–45.0)

## 2015-08-29 SURGERY — RIGHT HEART CATH
Anesthesia: LOCAL

## 2015-08-29 MED ORDER — LIDOCAINE HCL (PF) 1 % IJ SOLN
INTRAMUSCULAR | Status: DC | PRN
  Administered 2015-08-29: 10 mL via INTRADERMAL

## 2015-08-29 MED ORDER — SODIUM CHLORIDE 0.9 % IV SOLN: 250.0000 mL | INTRAVENOUS | Status: DC | PRN

## 2015-08-29 MED ORDER — SODIUM CHLORIDE 0.9 % IV SOLN
INTRAVENOUS | Status: DC
  Administered 2015-08-29: 07:00:00 via INTRAVENOUS

## 2015-08-29 MED ORDER — SODIUM CHLORIDE 0.9% FLUSH: 3.0000 mL | INTRAVENOUS | Status: DC | PRN

## 2015-08-29 MED ORDER — SODIUM CHLORIDE 0.9 % IV SOLN: INTRAVENOUS | Status: DC

## 2015-08-29 MED ORDER — SODIUM CHLORIDE 0.9% FLUSH: 3.0000 mL | Freq: Two times a day (BID) | INTRAVENOUS | Status: DC

## 2015-08-29 MED ORDER — HEPARIN (PORCINE) IN NACL 2-0.9 UNIT/ML-% IJ SOLN
INTRAMUSCULAR | Status: DC | PRN
  Administered 2015-08-29: 500 mL

## 2015-08-29 SURGICAL SUPPLY — 4 items
CATH SWAN GANZ 7F STRAIGHT (CATHETERS) ×1 IMPLANT
KIT HEART RIGHT NAMIC (KITS) ×1 IMPLANT
PACK CARDIAC CATHETERIZATION (CUSTOM PROCEDURE TRAY) ×1 IMPLANT
SHEATH PINNACLE 7F 10CM (SHEATH) ×1 IMPLANT

## 2015-08-29 NOTE — Interval H&P Note (Signed)
History and Physical Interval Note:  08/29/2015 7:48 AM  Regina [name]  has presented today for surgery, with the diagnosis of SOB  The various methods of treatment have been discussed with the patient and family. After consideration of risks, benefits and other options for treatment, the patient has consented to  Procedure(s): Right Heart Cath (N/A) as a surgical intervention .  The patient's history has been reviewed, patient examined, no change in status, stable for surgery.  I have reviewed the patient's chart and labs.  Questions were answered to the patient's satisfaction.     [name]

## 2015-08-29 NOTE — Discharge Instructions (Signed)
Angiogram, Care After °Refer to this sheet in the next few weeks. These instructions provide you with information about caring for yourself after your procedure. Your health care provider may also give you more specific instructions. Your treatment has been planned according to current medical practices, but problems sometimes occur. Call your health care provider if you have any problems or questions after your procedure. °WHAT TO EXPECT AFTER THE PROCEDURE °After your procedure, it is typical to have the following: °· Bruising at the catheter insertion site that usually fades within 1-2 weeks. °· Blood collecting in the tissue (hematoma) that may be painful to the touch. It should usually decrease in size and tenderness within 1-2 weeks. °HOME CARE INSTRUCTIONS °· Take medicines only as directed by your health care provider. °· You may shower 24-48 hours after the procedure or as directed by your health care provider. Remove the bandage (dressing) and gently wash the site with plain soap and water. Pat the area dry with a clean towel. Do not rub the site, because this may cause bleeding. °· Do not take baths, swim, or use a hot tub until your health care provider approves. °· Check your insertion site every day for redness, swelling, or drainage. °· Do not apply powder or lotion to the site. °· Do not lift over 10 lb (4.5 kg) for 5 days after your procedure or as directed by your health care provider. °· Ask your health care provider when it is okay to: °¨ Return to work or school. °¨ Resume usual physical activities or sports. °¨ Resume sexual activity. °· Do not drive home if you are discharged the same day as the procedure. Have someone else drive you. °· You may drive 24 hours after the procedure unless otherwise instructed by your health care provider. °· Do not operate machinery or power tools for 24 hours after the procedure or as directed by your health care provider. °· If your procedure was done as an  outpatient procedure, which means that you went home the same day as your procedure, a responsible adult should be with you for the first 24 hours after you arrive home. °· Keep all follow-up visits as directed by your health care provider. This is important. °SEEK MEDICAL CARE IF: °· You have a fever. °· You have chills. °· You have increased bleeding from the catheter insertion site. Hold pressure on the site. °SEEK IMMEDIATE MEDICAL CARE IF: °· You have unusual pain at the catheter insertion site. °· You have redness, warmth, or swelling at the catheter insertion site. °· You have drainage (other than a small amount of blood on the dressing) from the catheter insertion site. °· The catheter insertion site is bleeding, and the bleeding does not stop after 30 minutes of holding steady pressure on the site. °· The area near or just beyond the catheter insertion site becomes pale, cool, tingly, or numb. °  °This information is not intended to replace advice given to you by your health care provider. Make sure you discuss any questions you have with your health care provider. °  °Document Released: 08/20/2004 Document Revised: 02/22/2014 Document Reviewed: 07/05/2012 °Elsevier Interactive Patient Education ©2016 [employer] ° °

## 2016-01-15 ENCOUNTER — Encounter: Payer: Self-pay | Admitting: Neurology

## 2016-01-15 ENCOUNTER — Ambulatory Visit (INDEPENDENT_AMBULATORY_CARE_PROVIDER_SITE_OTHER): Payer: Medicare Other | Admitting: Neurology

## 2016-01-15 VITALS — BP 90/50 | HR 80 | Ht 59.0 in | Wt <= 1120 oz

## 2016-01-15 DIAGNOSIS — R42 Dizziness and giddiness: Secondary | ICD-10-CM

## 2016-01-15 DIAGNOSIS — E43 Unspecified severe protein-calorie malnutrition: Secondary | ICD-10-CM | POA: Diagnosis not present

## 2016-01-15 DIAGNOSIS — R29898 Other symptoms and signs involving the musculoskeletal system: Secondary | ICD-10-CM

## 2016-01-15 NOTE — Patient Instructions (Signed)
    We will check MRI of the brain. 

## 2016-01-15 NOTE — Progress Notes (Signed)
Reason for visit: hand weakness  Referring physician: Dr. Charma Igo Regina [name] is a 70 y.o. female  History of present illness:  Regina [name] is a 70 year old right-handed white female with a history of poor nutritional status since 1998. The patient has maintained an emaciated state since that time. On 12/14/2015, the patient took a nap in a computer chair, and when she awakened she had a significant right wrist drop, and some numbness on the right hand. The patient indicates that since that time, the strength in the hand has returned significantly, the strength is almost normal at this time. The patient reported no significant pain in the neck or down the arm. 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 The patient denies any headaches or vision changes or changes in speech or swallowing. Within the last 3 or 4 days, the patient has had dizziness that will occur with standing and gets better with sitting or lying down. The patient denies true vertigo. She has not had any fainting episodes or falls because of the dizziness, but she feels weak all over. She indicates that she has been drinking plenty of fluids. The patient is sent to this office for further evaluation.  Past Medical History:  Diagnosis Date  . Arthritis   . Ascending aortic aneurysm ([place]) 08/23/2011  . Cervical spondylosis with myelopathy   . Chronic kidney disease    urine incontinence  . COPD (chronic obstructive pulmonary disease) ([place]) 08/23/2011  . Hypertension   . Hyponatremia   . Occipital neuralgia     Past Surgical History:  Procedure Laterality Date  . BACK SURGERY     multiple  . CARDIAC CATHETERIZATION N/A 08/29/2015   Procedure: Right Heart Cath;  Surgeon: [name], MD;  Location: [facility];  Service: Cardiovascular;  Laterality: N/A;  . CHOLECYSTECTOMY  1969  . SPINAL FUSION     cervical  fusion  . SPINAL FUSION  2003    Family History  Problem Relation Age of Onset  . Heart attack Father   . Stroke Mother     Social history:  reports that she has been smoking Cigarettes.  She has a 79.50 pack-year smoking history. She has never used smokeless tobacco. She reports that she drinks about 8.4 oz of alcohol per week . She reports that she does not use drugs.  Medications:  Prior to Admission medications   Medication Sig Start Date End Date Taking? Authorizing Provider  acetaminophen-codeine (TYLENOL #3) 300-30 MG per tablet Take 1 tablet by mouth every 6 (six) hours as needed for moderate pain.    Yes Historical Provider, MD  clorazepate (TRANXENE) 3.75 MG tablet Take 3.75 mg by mouth 3 (three) times daily as needed for anxiety.    Yes Historical Provider, MD  isosorbide mononitrate (IMDUR) 30 MG 24 hr tablet Take 30 mg by mouth daily.   Yes Historical Provider, MD  multivitamin-iron-minerals-folic acid (CENTRUM) chewable tablet Chew 1 tablet by mouth daily.   Yes Historical Provider, MD  spironolactone (ALDACTONE) 25 MG tablet Take 25 mg by mouth daily. 08/01/15  Yes Historical Provider, MD      Allergies  Allergen Reactions  . Azithromycin     rash  . Erythromycin Rash    ROS:  Out of a complete 14 system review of symptoms, the patient complains only of the following symptoms, and all other reviewed systems are  negative.  Weight loss, fatigue Swelling in the legs Difficulty swallowing Moles Blurred vision Shortness of breath, cough Incontinence of bowel, diarrhea Urination problems, incontinence of bladder Easy bruising, easy bleeding Feeling cold Joint pain, achy muscles Runny nose, skin sensitivity Memory loss, confusion, numbness, weakness, slurred speech, difficulty swallowing, dizziness 2 much sleep, low energy, decreased appetite, disinterest in activities  Blood pressure (!) 90/50, pulse 80, height 4\' 11"  (1.499 m), weight 60 lb (27.2 kg).    With standing, blood pressure, right arm, is [id number] systolic.  Physical Exam  General: The patient is alert and cooperative at the time of the examination. The patient appears to be emaciated.  Eyes: Pupils are equal, round, and reactive to light. Discs are flat bilaterally.  Neck: The neck is supple, no carotid bruits are noted.  Respiratory: The respiratory examination is clear.  Cardiovascular: The cardiovascular examination reveals a regular rate and rhythm, no obvious murmurs or rubs are noted.  Skin: Extremities are with trace edema bilaterally at the ankles.  Neurologic Exam  Mental status: The patient is alert and oriented x 3 at the time of the examination. The patient has apparent normal recent and remote memory, with an apparently normal attention span and concentration ability.  Cranial nerves: Facial symmetry is present. There is good sensation of the face to pinprick and soft touch on the left, decreased on the right face. The strength of the facial muscles and the muscles to head turning and shoulder shrug are normal bilaterally. Speech is well enunciated, no aphasia or dysarthria is noted. Extraocular movements are full. Visual fields are full. The tongue is midline, and the patient has symmetric elevation of the soft palate. No obvious hearing deficits are noted.  Motor: The motor testing reveals 5 over 5 strength of all 4 extremities, with exception of some weakness with extension of the left wrist, the right wrist appears to be of good strength. Some weakness of the intrinsic muscles of the left hand is noted. Good symmetric motor tone is noted throughout.  Sensory: Sensory testing is intact to pinprick, soft touch, vibration sensation, and position sense on all 4 extremities, with exception of some decreased pinprick sensation on the right hand and arm as compared to the left. No evidence of extinction is noted.  Coordination: Cerebellar testing reveals good  finger-nose-finger and heel-to-shin bilaterally.  Gait and station: Gait is wide-based, unsteady, slight circumduction type gait with the left leg. Tandem gait was not attempted. The patient normally walks with a walker. Romberg is negative. No drift is seen.  Reflexes: Deep tendon reflexes are symmetric and normal bilaterally. Toes are upgoing bilaterally.   Assessment/Plan:  1. Cervical myelopathy  2. Gait disorder  3. 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 New onset of dizziness, generalized weakness, right face and arm numbness  5. 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 The patient does have risk factors for cerebrovascular disease, MRI of the brain will be done. If the dizziness persists and the brain MRI is unremarkable, a carotid Doppler study may be done.  Jill Alexanders MD 01/15/2016 12:11 PM  [facility] [address] [place], [place] [address]  Phone [phone] Fax [phone]

## 2016-01-19 ENCOUNTER — Emergency Department (HOSPITAL_COMMUNITY): Payer: Medicare Other

## 2016-01-19 ENCOUNTER — Inpatient Hospital Stay (HOSPITAL_COMMUNITY)
Admission: EM | Admit: 2016-01-19 | Discharge: 2016-01-26 | DRG: 871 | Disposition: A | Discharge status: Skilled Nursing Facility | Payer: Medicare Other | Attending: Internal Medicine | Admitting: Internal Medicine

## 2016-01-19 ENCOUNTER — Encounter (HOSPITAL_COMMUNITY): Payer: Self-pay | Admitting: Emergency Medicine

## 2016-01-19 DIAGNOSIS — R64 Cachexia: Secondary | ICD-10-CM | POA: Diagnosis present

## 2016-01-19 DIAGNOSIS — K921 Melena: Secondary | ICD-10-CM | POA: Diagnosis present

## 2016-01-19 DIAGNOSIS — Z9049 Acquired absence of other specified parts of digestive tract: Secondary | ICD-10-CM

## 2016-01-19 DIAGNOSIS — E872 Acidosis: Secondary | ICD-10-CM | POA: Diagnosis present

## 2016-01-19 DIAGNOSIS — G92 Toxic encephalopathy: Secondary | ICD-10-CM | POA: Diagnosis present

## 2016-01-19 DIAGNOSIS — R6251 Failure to thrive (child): Secondary | ICD-10-CM

## 2016-01-19 DIAGNOSIS — I951 Orthostatic hypotension: Secondary | ICD-10-CM | POA: Diagnosis not present

## 2016-01-19 DIAGNOSIS — G4710 Hypersomnia, unspecified: Secondary | ICD-10-CM | POA: Diagnosis present

## 2016-01-19 DIAGNOSIS — F17210 Nicotine dependence, cigarettes, uncomplicated: Secondary | ICD-10-CM | POA: Diagnosis present

## 2016-01-19 DIAGNOSIS — I129 Hypertensive chronic kidney disease with stage 1 through stage 4 chronic kidney disease, or unspecified chronic kidney disease: Secondary | ICD-10-CM | POA: Diagnosis present

## 2016-01-19 DIAGNOSIS — T424X4D Poisoning by benzodiazepines, undetermined, subsequent encounter: Secondary | ICD-10-CM | POA: Diagnosis not present

## 2016-01-19 DIAGNOSIS — L89329 Pressure ulcer of left buttock, unspecified stage: Secondary | ICD-10-CM | POA: Diagnosis present

## 2016-01-19 DIAGNOSIS — Z681 Body mass index (BMI) 19 or less, adult: Secondary | ICD-10-CM | POA: Diagnosis not present

## 2016-01-19 DIAGNOSIS — L89319 Pressure ulcer of right buttock, unspecified stage: Secondary | ICD-10-CM | POA: Diagnosis present

## 2016-01-19 DIAGNOSIS — Z881 Allergy status to other antibiotic agents status: Secondary | ICD-10-CM

## 2016-01-19 DIAGNOSIS — A419 Sepsis, unspecified organism: Secondary | ICD-10-CM

## 2016-01-19 DIAGNOSIS — Z79899 Other long term (current) drug therapy: Secondary | ICD-10-CM | POA: Diagnosis not present

## 2016-01-19 DIAGNOSIS — E43 Unspecified severe protein-calorie malnutrition: Secondary | ICD-10-CM | POA: Diagnosis present

## 2016-01-19 DIAGNOSIS — J690 Pneumonitis due to inhalation of food and vomit: Secondary | ICD-10-CM | POA: Diagnosis present

## 2016-01-19 DIAGNOSIS — R188 Other ascites: Secondary | ICD-10-CM

## 2016-01-19 DIAGNOSIS — R634 Abnormal weight loss: Secondary | ICD-10-CM

## 2016-01-19 DIAGNOSIS — T424X4A Poisoning by benzodiazepines, undetermined, initial encounter: Secondary | ICD-10-CM

## 2016-01-19 DIAGNOSIS — I712 Thoracic aortic aneurysm, without rupture: Secondary | ICD-10-CM | POA: Diagnosis present

## 2016-01-19 DIAGNOSIS — J9601 Acute respiratory failure with hypoxia: Secondary | ICD-10-CM

## 2016-01-19 DIAGNOSIS — Z66 Do not resuscitate: Secondary | ICD-10-CM | POA: Diagnosis present

## 2016-01-19 DIAGNOSIS — E876 Hypokalemia: Secondary | ICD-10-CM | POA: Diagnosis present

## 2016-01-19 DIAGNOSIS — N179 Acute kidney failure, unspecified: Secondary | ICD-10-CM | POA: Diagnosis present

## 2016-01-19 DIAGNOSIS — M1990 Unspecified osteoarthritis, unspecified site: Secondary | ICD-10-CM | POA: Diagnosis present

## 2016-01-19 DIAGNOSIS — J9622 Acute and chronic respiratory failure with hypercapnia: Secondary | ICD-10-CM | POA: Diagnosis present

## 2016-01-19 DIAGNOSIS — N189 Chronic kidney disease, unspecified: Secondary | ICD-10-CM | POA: Diagnosis present

## 2016-01-19 DIAGNOSIS — Z981 Arthrodesis status: Secondary | ICD-10-CM | POA: Diagnosis not present

## 2016-01-19 DIAGNOSIS — J9621 Acute and chronic respiratory failure with hypoxia: Secondary | ICD-10-CM | POA: Diagnosis present

## 2016-01-19 DIAGNOSIS — T402X1S Poisoning by other opioids, accidental (unintentional), sequela: Secondary | ICD-10-CM | POA: Diagnosis not present

## 2016-01-19 DIAGNOSIS — R627 Adult failure to thrive: Secondary | ICD-10-CM | POA: Diagnosis present

## 2016-01-19 DIAGNOSIS — M5481 Occipital neuralgia: Secondary | ICD-10-CM | POA: Diagnosis present

## 2016-01-19 DIAGNOSIS — J9602 Acute respiratory failure with hypercapnia: Secondary | ICD-10-CM

## 2016-01-19 DIAGNOSIS — G8929 Other chronic pain: Secondary | ICD-10-CM | POA: Diagnosis present

## 2016-01-19 DIAGNOSIS — L89159 Pressure ulcer of sacral region, unspecified stage: Secondary | ICD-10-CM | POA: Diagnosis present

## 2016-01-19 DIAGNOSIS — T380X5A Adverse effect of glucocorticoids and synthetic analogues, initial encounter: Secondary | ICD-10-CM | POA: Diagnosis not present

## 2016-01-19 DIAGNOSIS — E860 Dehydration: Secondary | ICD-10-CM | POA: Diagnosis present

## 2016-01-19 DIAGNOSIS — L8990 Pressure ulcer of unspecified site, unspecified stage: Secondary | ICD-10-CM | POA: Insufficient documentation

## 2016-01-19 DIAGNOSIS — R0603 Acute respiratory distress: Secondary | ICD-10-CM

## 2016-01-19 DIAGNOSIS — R630 Anorexia: Secondary | ICD-10-CM

## 2016-01-19 DIAGNOSIS — T402X1A Poisoning by other opioids, accidental (unintentional), initial encounter: Secondary | ICD-10-CM

## 2016-01-19 DIAGNOSIS — J441 Chronic obstructive pulmonary disease with (acute) exacerbation: Secondary | ICD-10-CM | POA: Diagnosis present

## 2016-01-19 DIAGNOSIS — R6510 Systemic inflammatory response syndrome (SIRS) of non-infectious origin without acute organ dysfunction: Secondary | ICD-10-CM | POA: Diagnosis not present

## 2016-01-19 LAB — URINALYSIS, ROUTINE W REFLEX MICROSCOPIC
Bilirubin Urine: NEGATIVE
GLUCOSE, UA: 500 mg/dL — AB
Ketones, ur: NEGATIVE mg/dL
Leukocytes, UA: NEGATIVE
Nitrite: NEGATIVE
Protein, ur: 30 mg/dL — AB
SPECIFIC GRAVITY, URINE: 1.026 (ref 1.005–1.030)
pH: 5 (ref 5.0–8.0)

## 2016-01-19 LAB — MRSA PCR SCREENING: MRSA BY PCR: NEGATIVE

## 2016-01-19 LAB — LACTIC ACID, PLASMA
LACTIC ACID, VENOUS: 2.1 mmol/L — AB (ref 0.5–1.9)
Lactic Acid, Venous: 1.9 mmol/L (ref 0.5–1.9)

## 2016-01-19 LAB — BLOOD GAS, VENOUS
Acid-base deficit: 3.1 mmol/L — ABNORMAL HIGH (ref 0.0–2.0)
Bicarbonate: 27 mmol/L (ref 20.0–28.0)
DRAWN BY: 10609
FIO2: 100
O2 Saturation: 38.3 %
PATIENT TEMPERATURE: 98.6
pCO2, Ven: 70.1 mmHg (ref 44.0–60.0)
pH, Ven: 7.209 — ABNORMAL LOW (ref 7.250–7.430)

## 2016-01-19 LAB — CBC WITH DIFFERENTIAL/PLATELET
BASOS PCT: 0 %
Basophils Absolute: 0 10*3/uL (ref 0.0–0.1)
EOS ABS: 0 10*3/uL (ref 0.0–0.7)
Eosinophils Relative: 0 %
HCT: 51.6 % — ABNORMAL HIGH (ref 36.0–46.0)
HEMOGLOBIN: 17.2 g/dL — AB (ref 12.0–15.0)
Lymphocytes Relative: 2 %
Lymphs Abs: 0.5 10*3/uL — ABNORMAL LOW (ref 0.7–4.0)
MCH: 30.2 pg (ref 26.0–34.0)
MCHC: 33.3 g/dL (ref 30.0–36.0)
MCV: 90.5 fL (ref 78.0–100.0)
MONOS PCT: 7 %
Monocytes Absolute: 1.4 10*3/uL — ABNORMAL HIGH (ref 0.1–1.0)
NEUTROS PCT: 91 %
Neutro Abs: 19.9 10*3/uL — ABNORMAL HIGH (ref 1.7–7.7)
Platelets: 255 10*3/uL (ref 150–400)
RBC: 5.7 MIL/uL — ABNORMAL HIGH (ref 3.87–5.11)
RDW: 14.3 % (ref 11.5–15.5)
WBC: 21.9 10*3/uL — ABNORMAL HIGH (ref 4.0–10.5)

## 2016-01-19 LAB — BLOOD GAS, ARTERIAL
ACID-BASE DEFICIT: 2.8 mmol/L — AB (ref 0.0–2.0)
BICARBONATE: 26.7 mmol/L (ref 20.0–28.0)
Drawn by: 295031
FIO2: 100
O2 Saturation: 99 %
PEEP/CPAP: 5 cmH2O
PRESSURE CONTROL: 8 cmH2O
Patient temperature: 98.6
RATE: 18 resp/min
pCO2 arterial: 69.9 mmHg (ref 32.0–48.0)
pH, Arterial: 7.207 — ABNORMAL LOW (ref 7.350–7.450)
pO2, Arterial: 281 mmHg — ABNORMAL HIGH (ref 83.0–108.0)

## 2016-01-19 LAB — COMPREHENSIVE METABOLIC PANEL
ALBUMIN: 4.3 g/dL (ref 3.5–5.0)
ALT: 27 U/L (ref 14–54)
AST: 32 U/L (ref 15–41)
Alkaline Phosphatase: 88 U/L (ref 38–126)
Anion gap: 11 (ref 5–15)
BUN: 26 mg/dL — AB (ref 6–20)
CHLORIDE: 93 mmol/L — AB (ref 101–111)
CO2: 27 mmol/L (ref 22–32)
Calcium: 10.8 mg/dL — ABNORMAL HIGH (ref 8.9–10.3)
Creatinine, Ser: 0.7 mg/dL (ref 0.44–1.00)
GFR calc Af Amer: >60 mL/min (ref >60)
GFR calc non Af Amer: >60 mL/min (ref >60)
GLUCOSE: 187 mg/dL — AB (ref 65–99)
POTASSIUM: 3.8 mmol/L (ref 3.5–5.1)
SODIUM: 131 mmol/L — AB (ref 135–145)
Total Bilirubin: 1 mg/dL (ref 0.3–1.2)
Total Protein: 8.3 g/dL — ABNORMAL HIGH (ref 6.5–8.1)

## 2016-01-19 LAB — I-STAT CHEM 8, ED
BUN: 26 mg/dL — ABNORMAL HIGH (ref 6–20)
Calcium, Ion: 1.39 mmol/L (ref 1.15–1.40)
Chloride: 94 mmol/L — ABNORMAL LOW (ref 101–111)
Creatinine, Ser: 0.8 mg/dL (ref 0.44–1.00)
Glucose, Bld: 176 mg/dL — ABNORMAL HIGH (ref 65–99)
HEMATOCRIT: 58 % — AB (ref 36.0–46.0)
HEMOGLOBIN: 19.7 g/dL — AB (ref 12.0–15.0)
Potassium: 3.8 mmol/L (ref 3.5–5.1)
SODIUM: 134 mmol/L — AB (ref 135–145)
TCO2: 27 mmol/L (ref 0–100)

## 2016-01-19 LAB — I-STAT TROPONIN, ED
TROPONIN I, POC: 0.06 ng/mL (ref 0.00–0.08)
Troponin i, poc: 0.05 ng/mL (ref 0.00–0.08)

## 2016-01-19 LAB — URINE MICROSCOPIC-ADD ON

## 2016-01-19 LAB — PROCALCITONIN
PROCALCITONIN: 0.2 ng/mL
Procalcitonin: 0.36 ng/mL

## 2016-01-19 LAB — SALICYLATE LEVEL: Salicylate Lvl: <7 mg/dL (ref 2.8–30.0)

## 2016-01-19 LAB — RAPID URINE DRUG SCREEN, HOSP PERFORMED
AMPHETAMINES: NOT DETECTED
BENZODIAZEPINES: POSITIVE — AB
Barbiturates: NOT DETECTED
Cocaine: NOT DETECTED
OPIATES: POSITIVE — AB
TETRAHYDROCANNABINOL: NOT DETECTED

## 2016-01-19 LAB — ACETAMINOPHEN LEVEL: Acetaminophen (Tylenol), Serum: <10 ug/mL — ABNORMAL LOW (ref 10–30)

## 2016-01-19 LAB — I-STAT CG4 LACTIC ACID, ED: LACTIC ACID, VENOUS: 3.32 mmol/L — AB (ref 0.5–1.9)

## 2016-01-19 LAB — ETHANOL: Alcohol, Ethyl (B): <5 mg/dL (ref <5)

## 2016-01-19 LAB — CBG MONITORING, ED: Glucose-Capillary: 155 mg/dL — ABNORMAL HIGH (ref 65–99)

## 2016-01-19 LAB — STREP PNEUMONIAE URINARY ANTIGEN: Strep Pneumo Urinary Antigen: NEGATIVE

## 2016-01-19 MED ORDER — IPRATROPIUM-ALBUTEROL 0.5-2.5 (3) MG/3ML IN SOLN
3.0000 mL | Freq: Four times a day (QID) | RESPIRATORY_TRACT | Status: DC
  Administered 2016-01-19 – 2016-01-20 (×5): 3 mL via RESPIRATORY_TRACT
  Filled 2016-01-19 (×7): qty 3

## 2016-01-19 MED ORDER — NALOXONE HCL 2 MG/2ML IJ SOSY
2.0000 mg/h | PREFILLED_SYRINGE | INTRAVENOUS | Status: DC
  Administered 2016-01-19 (×2): 2 mg/h via INTRAVENOUS
  Filled 2016-01-19 (×2): qty 4

## 2016-01-19 MED ORDER — PIPERACILLIN-TAZOBACTAM 3.375 G IVPB 30 MIN
3.3750 g | Freq: Once | INTRAVENOUS | Status: AC
  Administered 2016-01-19: 3.375 g via INTRAVENOUS
  Filled 2016-01-19: qty 50

## 2016-01-19 MED ORDER — DEXTROSE 5 % IV SOLN
0.2500 ug/kg/h | Freq: Once | INTRAVENOUS | Status: DC
  Filled 2016-01-19: qty 2

## 2016-01-19 MED ORDER — SODIUM CHLORIDE 0.9 % IV BOLUS (SEPSIS)
1000.0000 mL | Freq: Once | INTRAVENOUS | Status: AC
  Administered 2016-01-19: 1000 mL via INTRAVENOUS

## 2016-01-19 MED ORDER — HEPARIN SODIUM (PORCINE) 5000 UNIT/ML IJ SOLN
2500.0000 [IU] | Freq: Two times a day (BID) | INTRAMUSCULAR | Status: DC
  Administered 2016-01-19 – 2016-01-26 (×15): 2500 [IU] via SUBCUTANEOUS
  Filled 2016-01-19 (×13): qty 1
  Filled 2016-01-19: qty 0.5
  Filled 2016-01-19 (×2): qty 1

## 2016-01-19 MED ORDER — VANCOMYCIN HCL IN DEXTROSE 1-5 GM/200ML-% IV SOLN
1000.0000 mg | Freq: Once | INTRAVENOUS | Status: AC
  Administered 2016-01-19: 1000 mg via INTRAVENOUS
  Filled 2016-01-19: qty 200

## 2016-01-19 MED ORDER — ONDANSETRON HCL 4 MG/2ML IJ SOLN: 4.0000 mg | Freq: Four times a day (QID) | INTRAMUSCULAR | Status: DC | PRN

## 2016-01-19 MED ORDER — PIPERACILLIN-TAZOBACTAM IN DEX 2-0.25 GM/50ML IV SOLN
2.2500 g | Freq: Three times a day (TID) | INTRAVENOUS | Status: DC
  Administered 2016-01-19 – 2016-01-22 (×9): 2.25 g via INTRAVENOUS
  Filled 2016-01-19 (×11): qty 50

## 2016-01-19 MED ORDER — NALOXONE HCL 0.4 MG/ML IJ SOLN
0.4000 mg | Freq: Once | INTRAMUSCULAR | Status: AC
  Administered 2016-01-19: 0.4 mg via INTRAVENOUS
  Filled 2016-01-19 (×2): qty 1

## 2016-01-19 MED ORDER — BUDESONIDE 0.5 MG/2ML IN SUSP
0.5000 mg | Freq: Two times a day (BID) | RESPIRATORY_TRACT | Status: DC
  Administered 2016-01-19 – 2016-01-20 (×3): 0.5 mg via RESPIRATORY_TRACT
  Filled 2016-01-19 (×4): qty 2

## 2016-01-19 MED ORDER — DEXTROSE-NACL 5-0.9 % IV SOLN
INTRAVENOUS | Status: DC
  Administered 2016-01-19 – 2016-01-26 (×9): via INTRAVENOUS

## 2016-01-19 MED ORDER — NALOXONE HCL 2 MG/2ML IJ SOSY
PREFILLED_SYRINGE | INTRAMUSCULAR | Status: AC
  Administered 2016-01-19: 06:00:00
  Filled 2016-01-19: qty 2

## 2016-01-19 MED ORDER — ACETAMINOPHEN 325 MG PO TABS
650.0000 mg | ORAL_TABLET | Freq: Four times a day (QID) | ORAL | Status: DC | PRN
  Administered 2016-01-20 – 2016-01-26 (×12): 650 mg via ORAL
  Filled 2016-01-19 (×12): qty 2

## 2016-01-19 MED ORDER — IPRATROPIUM-ALBUTEROL 0.5-2.5 (3) MG/3ML IN SOLN
3.0000 mL | Freq: Once | RESPIRATORY_TRACT | Status: AC
  Administered 2016-01-19: 3 mL via RESPIRATORY_TRACT
  Filled 2016-01-19: qty 3

## 2016-01-19 MED ORDER — ALBUTEROL SULFATE (2.5 MG/3ML) 0.083% IN NEBU: 2.5000 mg | INHALATION_SOLUTION | RESPIRATORY_TRACT | Status: DC | PRN

## 2016-01-19 MED ORDER — ENOXAPARIN SODIUM 40 MG/0.4ML ~~LOC~~ SOLN: 40.0000 mg | SUBCUTANEOUS | Status: DC

## 2016-01-19 MED ORDER — NALOXONE HCL 2 MG/2ML IJ SOSY
1.0000 mg/h | PREFILLED_SYRINGE | INTRAVENOUS | Status: DC
  Administered 2016-01-19: 1 mg/h via INTRAVENOUS
  Filled 2016-01-19 (×2): qty 4

## 2016-01-19 MED ORDER — SODIUM CHLORIDE 0.9 % IV SOLN: 250.0000 mL | INTRAVENOUS | Status: DC | PRN

## 2016-01-19 NOTE — ED Triage Notes (Signed)
Per EMS they were called out by family d/t pt being lethargic, poor po intake and generalized weakness.  Pt reported to EMS that she has felt poorly since last Tuesday.

## 2016-01-19 NOTE — Progress Notes (Signed)
Respiratory arrest during nebulizer therapy. Manually ventilated. Return of spontaneous respirations and following commands noted after approximatley 4 minutes. EDP at bedside.

## 2016-01-19 NOTE — Progress Notes (Signed)
Pharmacy Antibiotic Note  Regina [name] is a 70 y.o. female admitted on 01/19/2016 with pneumonia.  Pharmacy has been consulted for vancomycin and zosyn dosing.  Plan:  Zosyn 3.375g IV x 1 given in ED - given small weight, will continue with reduced dose of 2.25g IV q8h  Vancomycin 1g IV given in ED - with weight only 27kg, this is ~37 mg/kg.  Will check random vancomycin level in AM 12/5 and redose if < 15 mcg/ml    Temp (24hrs), Avg:96.5 F (35.8 C), Min:96.1 F (35.6 C), Max:97.3 F (36.3 C)   Recent Labs Lab 01/19/16 0500 01/19/16 0510 01/19/16 0622  WBC 21.9*  --   --   CREATININE 0.70 0.80  --   LATICACIDVEN  --   --  3.32*    Estimated Creatinine Clearance: 28.1 mL/min (by C-G formula based on SCr of 0.8 mg/dL).    Allergies  Allergen Reactions  . Azithromycin     rash  . Erythromycin Rash    Antimicrobials this admission:  12/4 Vanc >> 12/4 Zosyn >>  Dose adjustments this admission:  12/5 at 05:00 vancomycin random level = ___ following 1g IV at 06:55 on 12/4  Microbiology results:  12/4 BCx: sent 12/4 Urine strep Ag: 12/4 Urine legionella Ag: Thank you for allowing pharmacy to be a part of this patient's care.  Peggyann Juba, PharmD, BCPS Pager: [phone] 01/19/2016 10:45 AM

## 2016-01-19 NOTE — H&P (Signed)
PULMONARY / CRITICAL CARE MEDICINE   Name: Regina [name] MRN: [id number] DOB: [birth year]    ADMISSION DATE:  01/19/2016 CONSULTATION DATE:  01/19/16  REFERRING MD:  Dr. Tomi Bamberger   CHIEF COMPLAINT:  Lethargy, poor intake, generalized weakness  HISTORY OF PRESENT ILLNESS:   70 y/o F with PMH of arthritis, cervical spondylosis with myelopathy s/p spinal fusion, CKD, AAA, cholecystectomy, HTN, anorexia (since 1998, weighs 60lbs) and COPD (followed by Dr. Melvyn Novas, previous office RN of Dr. Annamaria Boots) who presented to [facility] on 12/4 via EMS with reports of lethargy, poor intake and generalized weakness.    Family reports she has been feeling poorly for one week with decreased PO intake, lethargy and weakness.  She was seen by Neurology for the same on 11/30 and an MRI was ordered but has not been completed.  They report she has not eaten or drank anything in days.  Initial ER evaluation was notable for UDS positive for benzodiazepines/opiates, Na 134, K 3.8, Cl 94, BUN 26, glucose 176, troponin 0.06, lactic acid 3.32, WBC 21.9, Hgb 17.2, and platelets 255. CXR showed a left basilar consolidation worrisome for pneumonia.  ABG on 100% FiO2 > 7.207 / 69.9 / 281 / 26.7.  The patients husband reports she has had a cough as of late and felt like she needed to clear secretions but has been unable to cough anything up.  He has found her asleep in her chair several times in the last week.  She typically walks with a walker but does have known pressure ulcers on spine and buttocks.  In the ER, she was placed on BiPAP for respiratory support as well as narcan gtt.  Staff reports she "goes down" when narcan gtt is off (per report but no further details of what she looks like off the gtt).  Family reports she has a living will and would not want resuscitation.   PCCM called for admit.     PAST MEDICAL HISTORY :  She  has a past medical history of Arthritis; Ascending aortic aneurysm ([place]) (08/23/2011); Cervical spondylosis with  myelopathy; Chronic kidney disease; COPD (chronic obstructive pulmonary disease) ([place]) (08/23/2011); Hypertension; Hyponatremia; and Occipital neuralgia.  PAST SURGICAL HISTORY: She  has a past surgical history that includes Back surgery; Spinal fusion; Spinal fusion (2003); Cholecystectomy (1969); and Cardiac catheterization (N/A, 08/29/2015).  Allergies  Allergen Reactions  . Azithromycin     rash  . Erythromycin Rash    No current facility-administered medications on file prior to encounter.    Current Outpatient Prescriptions on File Prior to Encounter  Medication Sig  . acetaminophen-codeine (TYLENOL #3) 300-30 MG per tablet Take 1 tablet by mouth every 6 (six) hours as needed for moderate pain.   . clorazepate (TRANXENE) 3.75 MG tablet Take 3.75 mg by mouth 3 (three) times daily as needed for anxiety.   . isosorbide mononitrate (IMDUR) 30 MG 24 hr tablet Take 30 mg by mouth daily.  . multivitamin-iron-minerals-folic acid (CENTRUM) chewable tablet Chew 1 tablet by mouth daily.  Marland Kitchen spironolactone (ALDACTONE) 25 MG tablet Take 25 mg by mouth daily.    FAMILY HISTORY:  Her indicated that her mother is deceased. She indicated that her father is deceased.    SOCIAL HISTORY: She  reports that she has been smoking Cigarettes.  She has a 79.50 pack-year smoking history. She has never used smokeless tobacco. She reports that she drinks about 8.4 oz of alcohol per week . She reports that she does not use drugs.  REVIEW OF SYSTEMS:  Unable to complete with patient.  Information obtained from prior medical documentation, staff and family at bedside.    SUBJECTIVE:   VITAL SIGNS: BP 136/92   Pulse 79   Temp (!) 96.1 F (35.6 C) (Rectal) Comment: RN and EDP notified.  Resp (!) 27   SpO2 100%   HEMODYNAMICS:    VENTILATOR SETTINGS: Vent Mode: PCV;BIPAP FiO2 (%):  [50 %-100 %] 50 % Set Rate:  [18 bmp] 18 bmp PEEP:  [6 cmH20] 6 cmH20  INTAKE / OUTPUT: No intake/output data  recorded.  PHYSICAL EXAMINATION: General:  Cachectic female in NAD on bipap Neuro:  Opens eyes with stimulation then falls back to sleep, pupils 87mm, spontaneously will move all extremities  HEENT:  MM pink/dry, no jvd, temporal wasting  Cardiovascular:  s1s2 rrr, no m/r/g Lungs:  Even/non-labored, lungs bilaterally coarse with scattered rhonchi  Abdomen:  Scaphoid, soft, decreased bowel sounds  Musculoskeletal:  No acute deformities  Skin:  Warm/dry, no edema, sacral decubitus, wounds along spinous process  LABS:  BMET  Recent Labs Lab 01/19/16 0500 01/19/16 0510  NA 131* 134*  K 3.8 3.8  CL 93* 94*  CO2 27  --   BUN 26* 26*  CREATININE 0.70 0.80  GLUCOSE 187* 176*    Electrolytes  Recent Labs Lab 01/19/16 0500  CALCIUM 10.8*    CBC  Recent Labs Lab 01/19/16 0500 01/19/16 0510  WBC 21.9*  --   HGB 17.2* 19.7*  HCT 51.6* 58.0*  PLT 255  --     Coag's No results for input(s): APTT, INR in the last 168 hours.  Sepsis Markers  Recent Labs Lab 01/19/16 0622  LATICACIDVEN 3.32*    ABG  Recent Labs Lab 01/19/16 0740  PHART 7.207*  PCO2ART 69.9*  PO2ART 281*    Liver Enzymes  Recent Labs Lab 01/19/16 0500  AST 32  ALT 27  ALKPHOS 88  BILITOT 1.0  ALBUMIN 4.3    Cardiac Enzymes No results for input(s): TROPONINI, PROBNP in the last 168 hours.  Glucose  Recent Labs Lab 01/19/16 0534  GLUCAP 155*    Imaging Dg Chest Portable 1 View  Result Date: 01/19/2016 CLINICAL DATA:  Worsening weakness and lethargy.  Onset 5 days ago. EXAM: PORTABLE CHEST 1 VIEW COMPARISON:  10/14/2014 FINDINGS: New airspace consolidation in the left base, possibly pneumonia. The right lung is clear. Mild chronic interstitial coarsening. Heart size is normal. Hilar and mediastinal contours are unremarkable and unchanged. Pulmonary vasculature is normal. IMPRESSION: Left base consolidation, suspicious for pneumonia. Followup PA and lateral chest X-ray is  recommended in 3-4 weeks following trial of antibiotic therapy to ensure resolution and exclude underlying malignancy. Electronically Signed   By: Andreas Newport M.D.   On: 01/19/2016 05:19     STUDIES:  12/04  CXR >> worrisome for LLL PNA   CULTURES: BCx2 12/4 >>   ANTIBIOTICS: Vanco 12/4 >>  Zosyn 12/4 >>   SIGNIFICANT EVENTS: 12/04  Admit with AMS, weakness.  Work up worrisome for LLL PNA, hypercarbic resp fx   DISCUSSION: 70 y/o F with PMH of COPD, FTT, anorexia (weighs 60 lbs on admit) admitted with LLL PNA, hypercarbic resp failure  ASSESSMENT / PLAN:  Acute Hypercarbic Respiratory Failure secondary to LLL PNA & COPD - continue BiPAP support for now - DNI/DNR in the event of arrest  - Will need cyclical support over next 24-48 hours.  Family informed of risk of aspiration / that bipap may  not be an option if she worsens  - reduce O2 to 40% - sat goal 88-95%  LL PNA  - vanco / zosyn as above - D1/7 abx  - pulmonary hygiene as able - IS, mobilize - assess urine legionella, strep antigen - blood cultures as above  Moderate to Severe COPD  Tobacco Abuse  - tobacco cessation counseling when able - PRN albuterol   At Risk Aspiration  - NPO x meds   Acute Encephalopathy secondary to Hypercarbic Respiratory Failure in the setting of LLL PNA, COPD, narcotic use and malnutrition - limit sedating medications - Narcan gtt as below  - BiPAP support   Failure to Thrive secondary to Anorexia  Severe Protein Calorie Malnutrition  - will ask nutrition to see for dietary needs - caution with possible re-feeding syndrome   Decubitus Ulcers in the setting of Malnutrition - WOC consult for wounds  - pressure relief mattress  Chronic Pain / Cervical Fusion secondary to Cervical Spondylosis & Arthritis  - hold home narcotics as likely contributing to AMS - continue Narcan gtt for one more bag then d/c   Hx HTN, AAA - hold home regimen - Monitor in SDU   CKD  -  monitor BMP / UOP  - D5NS at 50 ml/hr given NPO status  Lactic Acidosis in setting of PNA, increased work of breathing, FTT - trend lactate - gentle hydration  Malpositioned IV - arterial placement on R  - d/c IV  DVT Prophylaxis: reduced dose heparin, discussed dosing with pharmacy   FAMILY  - Updates: Husband, daughter and sister updated at bedside.  Will continue aggressive medical measures up to the point of CPR / Intubation.  If declining, would transition to comfort care.  DNR/DNI.  May reach a point that bipap is unsafe if mental status worsens.    - Inter-disciplinary family meet or Palliative Care meeting due by:  Completed on admit.     Transfer to [facility] primary SVC as of 12/5 am.    CC Time:  35 minutes   Noe Gens, NP-C [facility] Pgr: [phone] or if no answer [phone] 01/19/2016, 9:14 AM   ATTENDING NOTE / ATTESTATION NOTE :   I have discussed the case with the resident/APP Noe Gens NP.   I agree with the resident/APP's  history, physical examination, assessment, and plans.    I have edited the above note and modified it according to our agreed history, physical examination, assessment and plan.   Briefly, pt with significant co-morbidities including COPD, chronic pain, AAA, chronic weight loss/anorexia since having her GB removed, comes in with acute or chronic hypersomnia.  Per husband, pt has not been eating well for yrs and has lost 60 lbs over several yrs.  This yr, she lost 5 lbs.  Pt has anorexia since having her GB removed and she has been struggling with that issue.  She also has chronic pain 2/2 back issues but per husband, she has NOT been taking more pain meds. Maybe tylenol 3  3x/week.  Pt with in and out lethargy, worse since Thanksgiving.  Husband noted she has been sleeping more the last 1-2 weeks.  She would wake up to eat some then lie down again.  Recent SOB, cough.  As she was sleepier than baseline, she was brought to  ED.  At the ED, she got some narcan pushes and started on narcan drip and was noted to be more awake.  ABG showed acute on  chronic hypercapnea. She was placed on Bipap and transferred to ICU.   Pt seen, examined.  Drowsy, arousable on Bipap. In mild distress. Chronically ill. Cachectic.  Unable to examine airway 2/2 bipap mask. (-) NVD.  Fair ae. Rhonchi in BLF, more at bases. (-) wheeze/crackles. Good s1/s2. (-) m. (+) BS, soft, (-) masses. (-) edema. (+) sacral ulcer. Rest of exam per Brandi.   Labs reviewed.  CXR with LLL infiltrate.  WBC elevated. Lactate elevated but rpt is better. Creatinine is probably elevated for her as she does not have much mass. ABG with acutte on chronic hypercapnea.   Assessment : 1. 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 Mild AECOPD. 3. Likely with moderate-severe COPD. 4. Aspiration PNA, LLL 5. Failure to thrive.  6. Sacral ulcer. 7. AKI 8. Severe malnutrition  Plan : 1. Code status and pt's over all condition and prognosis were discussed with pt's husband.  Pt is a full DNR/DNI 2. Finish off narcan drip.  I don't think it is significantly making her better at this point and it did not sound that she was taking a lot of pain meds.  3. Avoid sedating meds. For agitation, may try precedex drip or haldol prn 4. Cont IVF. D/c in 24-48 hrs as she will just 3rd space.  5. Cont Vanc and Zosyn for now.  If cultures are (-), may switch to unasyn.  6. Panculture. Check PCT 7. Duoneb QID and Pulmicort BID 8. Swallow evaln when more awake.  9. Trend lactate.  10. Keep NPO 11. May continue with Bipap for now.  When more awake, may try to wean off. Keep o2 sats > 88%.  12. DVT prophylaxis  I have spent 30 minutes of critical care time with this patient today.  Family :Family updated at length today.  Plan d/w pt's husband and daughter by [facility] team on 12/4. Admit to ICU on 12/4 and transfer to SDU on 12/5 with [facility] as  primary and PCCM off unless with issues overnight.  [facility] aware of pt.    Monica Becton, MD 01/19/2016, 2:16 PM [facility] Pager [phone] After 3 pm or if no answer, call [phone]

## 2016-01-19 NOTE — ED Notes (Signed)
Pt more awake and answering question with 2 mg narcan given IV moved to Resus B for further evaluation and treatment pt still alert and talking. No respiratory or acute distress noted.

## 2016-01-19 NOTE — Progress Notes (Signed)
RT/RN repositioned full face mask and placed protective skin barrier to bridge of nose to help retain skin integrity.

## 2016-01-19 NOTE — Consult Note (Signed)
[facility] Nurse wound consult note Reason for Consult: Assess for pressure injury to coccyx, ears, spine.  Assessed today with WTA candidate Z. Suggs, Therapist, sports. Wound type:Pressure Pressure Ulcer POA: Yes Measurement: Bilateral ears with areas of deep purple/maroon discoloration, consistent with presentation of deep tissue pressure injury (DTPI).  Partial thickness tissue injury (skin tear) to left pretibial area measures <0.4cm round x 0.1cm deep.  Thoracic spine with areas of Stage 1 pressure injury as are bilateral ischial tuberosities; all are padded and protected with silicone foam dressings.  The coccyx presents with an area of non-blanching erythema measuring 5cm x 6cm with a 2cm x 1cm x 0.1cm Stage 2 pressure injury embedded within. Bilateral ischial tuberosities and heels present with Stage 1 pressure injuries Wound bed:As described above Drainage (amount, consistency, odor) Scant serous from coccyx and left pretibial, none from other areas. Periwound: intact, dry, flaccid Dressing procedure/placement/frequency: Patient with numerous bony prominences at risk for breakdown.  She is on a therapeutic mattress with low air loss feature in the ICU.  Guidance is provided for Nursing via the Orders for turning, repositioning, HOB elevation.  Prevalon pressure redistribution heel boots are provided bilaterally.  At her current weight (60#), pressure redistribution can be achieved while on our house mattress when transferred to floor. A pressure redistribution chair pad is provided for use when OOB in chair (husband states she sleeps sitting on sofa and often falls from this position to floor, where he has found her in the morning).  [facility] nursing team will not follow routinely, but will remain available to this patient, the nursing and medical teams.  Please re-consult if needed. Thanks, Maudie Flakes, MSN, RN, [place], Ar[facility]  Pager# [phone]

## 2016-01-19 NOTE — ED Notes (Signed)
Still unable to get pulse ox reading placed probe on forehead, finger and ear without accurate reading ER doctor informed.

## 2016-01-19 NOTE — ED Notes (Signed)
Narcan gtt started at 2 mg hour with pt still alert and talking to verbal stimuli no respiratory or acute distress noted NRB mask started family at bedside call light in reach.

## 2016-01-19 NOTE — Progress Notes (Signed)
Patient had new onset of bloody bowel movement, MD notified and CBC ordered per physician.

## 2016-01-19 NOTE — ED Notes (Signed)
Call to room by respiratory therapist pt with agonal respirations started ambu respirations per respiratory therapist Dr Randal Buba in room with nurse narcan 0.4mg  given.

## 2016-01-19 NOTE — ED Notes (Signed)
In and out cath unsuccessful.  During placement of catheter, RST noticed pt was unresponsive. EDP, Agricultural consultant and assigned nurse notified.   Pt was given rescue breaths by RST.  EDP began code protocols.

## 2016-01-19 NOTE — ED Notes (Signed)
Stage 2 pressure sore noted on buttocks on left cheek when cleaning up pt due to liquid bowel movement.

## 2016-01-19 NOTE — ED Notes (Signed)
Bed: [id number] Expected date:  Expected time:  Means of arrival:  Comments: 70 yo F/lethargic/ftt

## 2016-01-19 NOTE — Care Management Note (Signed)
Case Management Note  Patient Details  Name: Regina [name] MRN: [id number] Date of Birth: 1945-10-20  Subjective/Objective:      Anorexia, failure to thrive, overdose,               Action/Plan: Lives at home with family Date:  January 19, 2016 Chart reviewed for concurrent status and case management needs. Will continue to follow patient progress. Discharge Planning: following for needs/will need snf placement Expected discharge date: [id number] Velva Harman, BSN, [place], [place]   Expected Discharge Date:   (unknown)               Expected Discharge Plan:  Home/Self Care  In-House Referral:  Clinical Social Work, Nutrition  Discharge planning Services  CM Consult  Post Acute Care Choice:    Choice offered to:     DME Arranged:    DME Agency:     HH Arranged:    [place] Agency:     Status of Service:  In process, will continue to follow  If discussed at Long Length of Stay Meetings, dates discussed:    Additional Comments:  Leeroy Cha, RN 01/19/2016, 11:56 AM

## 2016-01-19 NOTE — ED Provider Notes (Signed)
[facility] DEPT Provider Note   CSN: [id number] Arrival date & time: 01/19/16  0443     History   Chief Complaint Chief Complaint  Patient presents with  . Failure To Thrive    HPI Regina [name] is a 70 y.o. female.  The history is provided by the patient and the EMS personnel. No language interpreter was used.  Altered Mental Status   This is a new problem. The current episode started more than 1 week ago. The problem has not changed since onset.Pertinent negatives include no unresponsiveness, no delusions, no hallucinations, no self-injury and no violence. Risk factors: narcotic pain medication. Her past medical history is significant for COPD. Her past medical history does not include diabetes, seizures or head trauma. Past medical history comments: chronic narcotics for chronic pain.  Is known to have ongoing anorexia since 1998.  No f/c/r.  Seen by neurology for same on 01/15/16 and they ordered an MRI.  No new medication was started.  According to family she has not eaten or drank in several days. They deny that she has taken too many of her medications.    Past Medical History:  Diagnosis Date  . Arthritis   . Ascending aortic aneurysm ([place]) 08/23/2011  . Cervical spondylosis with myelopathy   . Chronic kidney disease    urine incontinence  . COPD (chronic obstructive pulmonary disease) ([place]) 08/23/2011  . Hypertension   . Hyponatremia   . Occipital neuralgia     Patient Active Problem List   Diagnosis Date Noted  . Dizziness and giddiness 01/15/2016  . Right hand weakness 01/15/2016  . Pulmonary hypertension 08/24/2015  . CAP (community acquired pneumonia) 07/21/2014  . Community acquired pneumonia 07/21/2014  . Protein-calorie malnutrition, severe ([place]) 09/01/2012  . Pneumonia 08/31/2012  . Chronic pain syndrome 08/31/2012  . Anxiety state, unspecified 08/31/2012  . Cigarette smoker 08/31/2012  . Anorexia nervosa 08/31/2012  . Severe malnutrition ([place])  08/31/2012  . Venous insufficiency 06/01/2012  . Ascending aortic aneurysm ([place]) 08/23/2011  . COPD GOLD 0  08/23/2011  . Hypertension   . Cervical spondylosis with myelopathy   . Occipital neuralgia   . PERIPHERAL EDEMA 02/03/2010    Past Surgical History:  Procedure Laterality Date  . BACK SURGERY     multiple  . CARDIAC CATHETERIZATION N/A 08/29/2015   Procedure: Right Heart Cath;  Surgeon: [name], MD;  Location: [facility];  Service: Cardiovascular;  Laterality: N/A;  . CHOLECYSTECTOMY  1969  . SPINAL FUSION     cervical fusion  . SPINAL FUSION  2003    OB History    No data available       Home Medications    Prior to Admission medications   Medication Sig Start Date End Date Taking? Authorizing Provider  acetaminophen-codeine (TYLENOL #3) 300-30 MG per tablet Take 1 tablet by mouth every 6 (six) hours as needed for moderate pain.     Historical Provider, MD  clorazepate (TRANXENE) 3.75 MG tablet Take 3.75 mg by mouth 3 (three) times daily as needed for anxiety.     Historical Provider, MD  isosorbide mononitrate (IMDUR) 30 MG 24 hr tablet Take 30 mg by mouth daily.    Historical Provider, MD  multivitamin-iron-minerals-folic acid (CENTRUM) chewable tablet Chew 1 tablet by mouth daily.    Historical Provider, MD  spironolactone (ALDACTONE) 25 MG tablet Take 25 mg by mouth daily. 08/01/15   Historical Provider, MD    Family History Family History  Problem  Relation Age of Onset  . Heart attack Father   . Stroke Mother     Social History Social History  Substance Use Topics  . Smoking status: Current Every Day Smoker    Packs/day: 1.50    Years: 53.00    Types: Cigarettes  . Smokeless tobacco: Never Used  . Alcohol use 8.4 oz/week    14 Standard drinks or equivalent per week     Comment: occassional wine     Allergies   Azithromycin and Erythromycin   Review of Systems Review of Systems  Constitutional: Negative for diaphoresis and fever.    HENT: Negative for dental problem.   Respiratory: Negative for shortness of breath.   Cardiovascular: Negative for chest pain.  Gastrointestinal: Negative for abdominal pain.  Neurological: Negative for facial asymmetry and numbness.  Psychiatric/Behavioral: Negative for hallucinations and self-injury.  All other systems reviewed and are negative.    Physical Exam Updated Vital Signs There were no vitals taken for this visit.  Physical Exam  Constitutional: Vital signs are normal. She appears cachectic. She is cooperative. She has a sickly appearance (chronic).  HENT:  Head: Normocephalic and atraumatic.  Right Ear: External ear normal.  Left Ear: External ear normal.  Nose: Nose normal.  Mouth/Throat: No oropharyngeal exudate.  Eyes: EOM are normal.  Pinpoint pupils  Neck: Normal range of motion. No JVD present. No tracheal deviation present.  Cardiovascular: Normal rate, regular rhythm, normal heart sounds and intact distal pulses.   Pulmonary/Chest: No stridor. No respiratory distress. She has decreased breath sounds. She has rhonchi.  Abdominal: Soft. Bowel sounds are normal. She exhibits no mass. There is no tenderness. There is no rebound and no guarding.  Musculoskeletal: Normal range of motion.  Lymphadenopathy:    She has no cervical adenopathy.  Neurological: She is alert.  Skin: Capillary refill takes less than 2 seconds.  Psychiatric: She has a normal mood and affect.     ED Treatments / Results   Vitals:   01/19/16 0546 01/19/16 0549  BP: 179/84   Pulse: 88   Resp: (!) 30   Temp:  (!) 96.1 F (35.6 C)    Results for orders placed or performed during the hospital encounter of 01/19/16  CBC with Differential/Platelet  Result Value Ref Range   WBC 21.9 (H) 4.0 - 10.5 K/uL   RBC 5.70 (H) 3.87 - 5.11 MIL/uL   Hemoglobin 17.2 (H) 12.0 - 15.0 g/dL   HCT 51.6 (H) 36.0 - 46.0 %   MCV 90.5 78.0 - 100.0 fL   MCH 30.2 26.0 - 34.0 pg   MCHC 33.3 30.0 - 36.0  g/dL   RDW 14.3 11.5 - 15.5 %   Platelets 255 150 - 400 K/uL   Neutrophils Relative % 91 %   Neutro Abs 19.9 (H) 1.7 - 7.7 K/uL   Lymphocytes Relative 2 %   Lymphs Abs 0.5 (L) 0.7 - 4.0 K/uL   Monocytes Relative 7 %   Monocytes Absolute 1.4 (H) 0.1 - 1.0 K/uL   Eosinophils Relative 0 %   Eosinophils Absolute 0.0 0.0 - 0.7 K/uL   Basophils Relative 0 %   Basophils Absolute 0.0 0.0 - 0.1 K/uL  Comprehensive metabolic panel  Result Value Ref Range   Sodium 131 (L) 135 - 145 mmol/L   Potassium 3.8 3.5 - 5.1 mmol/L   Chloride 93 (L) 101 - 111 mmol/L   CO2 27 22 - 32 mmol/L   Glucose, Bld 187 (H) 65 -  99 mg/dL   BUN 26 (H) 6 - 20 mg/dL   Creatinine, Ser 0.70 0.44 - 1.00 mg/dL   Calcium 10.8 (H) 8.9 - 10.3 mg/dL   Total Protein 8.3 (H) 6.5 - 8.1 g/dL   Albumin 4.3 3.5 - 5.0 g/dL   AST 32 15 - 41 U/L   ALT 27 14 - 54 U/L   Alkaline Phosphatase 88 38 - 126 U/L   Total Bilirubin 1.0 0.3 - 1.2 mg/dL   GFR calc non Af Amer >60 >60 mL/min   GFR calc Af Amer >60 >60 mL/min   Anion gap 11 5 - 15  I-Stat Chem 8, ED  Result Value Ref Range   Sodium 134 (L) 135 - 145 mmol/L   Potassium 3.8 3.5 - 5.1 mmol/L   Chloride 94 (L) 101 - 111 mmol/L   BUN 26 (H) 6 - 20 mg/dL   Creatinine, Ser 0.80 0.44 - 1.00 mg/dL   Glucose, Bld 176 (H) 65 - 99 mg/dL   Calcium, Ion 1.39 1.15 - 1.40 mmol/L   TCO2 27 0 - 100 mmol/L   Hemoglobin 19.7 (H) 12.0 - 15.0 g/dL   HCT 58.0 (H) 36.0 - 46.0 %  I-stat troponin, ED  Result Value Ref Range   Troponin i, poc 0.06 0.00 - 0.08 ng/mL   Comment 3          CBG monitoring, ED  Result Value Ref Range   Glucose-Capillary 155 (H) 65 - 99 mg/dL   Dg Chest Portable 1 View  Result Date: 01/19/2016 CLINICAL DATA:  Worsening weakness and lethargy.  Onset 5 days ago. EXAM: PORTABLE CHEST 1 VIEW COMPARISON:  10/14/2014 FINDINGS: New airspace consolidation in the left base, possibly pneumonia. The right lung is clear. Mild chronic interstitial coarsening. Heart size is  normal. Hilar and mediastinal contours are unremarkable and unchanged. Pulmonary vasculature is normal. IMPRESSION: Left base consolidation, suspicious for pneumonia. Followup PA and lateral chest X-ray is recommended in 3-4 weeks following trial of antibiotic therapy to ensure resolution and exclude underlying malignancy. Electronically Signed   By: Andreas Newport M.D.   On: 01/19/2016 05:19    Procedures Procedures (including critical care time)  Medications Ordered in ED Medications  naloxone (NARCAN) 2 MG/2ML injection (not administered)  naloxone (NARCAN) 4 mg in dextrose 5 % 250 mL infusion (1 mg/hr Intravenous New Bag/Given 01/19/16 0603)  vancomycin (VANCOCIN) IVPB 1000 mg/200 mL premix (not administered)  piperacillin-tazobactam (ZOSYN) IVPB 3.375 g (3.375 g Intravenous New Bag/Given 01/19/16 0616)  ipratropium-albuterol (DUONEB) 0.5-2.5 (3) MG/3ML nebulizer solution 3 mL (3 mLs Nebulization Given 01/19/16 0539)  sodium chloride 0.9 % bolus 1,000 mL (1,000 mLs Intravenous New Bag/Given 01/19/16 0603)  naloxone [facility]) injection 0.4 mg (0.4 mg Intravenous Given 01/19/16 0604)    MDM Number of Diagnoses or Management Options Anorexia:  Aspiration pneumonia of left lower lobe, unspecified aspiration pneumonia type [facility]):  Benzodiazepine overdose of undetermined intent, initial encounter:  Opioid overdose, accidental or unintentional, initial encounter:  Polypharmacy:  Respiratory distress:  Sepsis, due to unspecified organism [facility]):  Critical Care Total time providing critical care: > 105 minutes  MDM Reviewed: previous chart, nursing note and vitals Reviewed previous: labs Interpretation: labs, ECG and x-ray (elevated white count, hemoconcentrated and dehydrated based on hemoglobin.  No chf by me on CXR) Total time providing critical care: > 105 minutes. This excludes time spent performing separately reportable procedures and services. Consults: critical care (Dr. Lake Bells will  send a PCCM doctor)  Bear hugger placed.   Medications  naloxone (NARCAN) 2 MG/2ML injection (not administered)  naloxone (NARCAN) 4 mg in dextrose 5 % 250 mL infusion (1 mg/hr Intravenous New Bag/Given 01/19/16 0603)  vancomycin (VANCOCIN) IVPB 1000 mg/200 mL premix (not administered)  piperacillin-tazobactam (ZOSYN) IVPB 3.375 g (3.375 g Intravenous New Bag/Given 01/19/16 0616)  ipratropium-albuterol (DUONEB) 0.5-2.5 (3) MG/3ML nebulizer solution 3 mL (3 mLs Nebulization Given 01/19/16 0539)  sodium chloride 0.9 % bolus 1,000 mL (1,000 mLs Intravenous New Bag/Given 01/19/16 0603)  naloxone [facility]) injection 0.4 mg (0.4 mg Intravenous Given 01/19/16 0604)   CRITICAL CARE Performed by: Carlisle Beers Total critical care time: 105  minutes Critical care time was exclusive of separately billable procedures and treating other patients. Critical care was necessary to treat or prevent imminent or life-threatening deterioration. Critical care was time spent personally by me on the following activities: development of treatment plan with patient and/or surrogate as well as nursing, discussions with consultants, evaluation of patient's response to treatment, examination of patient, obtaining history from patient or surrogate, ordering and performing treatments and interventions, ordering and review of laboratory studies, ordering and review of radiographic studies, pulse oximetry and re-evaluation of patient's condition.  Clinical Course    Became less responsive and stopped breathing but intact pulses,  Reversed immediately with narcan.  I suspect her ongoing issues with mental status are secondary to opioid usage. Based on her weight she is getting too much codeine. She could not say whether she was taking more than the recommended dose for her chronic back pain.  Given her reversal with narcan have started patient on narcan drip.  Will treat for PNA, based on weight already has had > 30 cc/kg  bolus of fluids and sepsis antibiotics were ordered.  Spoke to the family who cannot make a decision about code status.  Patient is alert and talking on a narcan drip.    I have informed family of the gravity of the patient's condition and they are at the bedside.    Patient was placed on BIPAP and Dr. Lake Bells agrees with this plan.       Veatrice Kells, MD 01/19/16 [phone]

## 2016-01-19 NOTE — Progress Notes (Signed)
Initial Nutrition Assessment  DOCUMENTATION CODES:   Severe malnutrition in context of chronic illness, Underweight  INTERVENTION:  - Diet advancement if medically feasible. - If pt requires TF, recommend Osmolite 1.2 @ 10 mL/hr and advance by 5 mL every 24 hours to reach goal rate of Osmolite 1.2 @ 35 mL/hr. At goal rate, this regimen will provide 1008 kcal, 47 grams of protein, 132 grams of carbohydrate, and 689 mL free water.  - Monitor magnesium, potassium, and phosphorus daily for at least 3 days, MD to replete as needed, as pt is at risk for refeeding syndrome given hx of severe malnutrition for pt with anorexia nervosa; she is currently receiving 60 grams of dextrose from IVF. - RD will follow-up 12/5.  NUTRITION DIAGNOSIS:   Malnutrition related to chronic illness as evidenced by severe depletion of muscle mass, severe depletion of body fat.  GOAL:   Patient will meet greater than or equal to 90% of their needs  MONITOR:   Diet advancement, Weight trends, Labs, Skin, I & O's, Other (Comment) (Need for nutrition support)  REASON FOR ASSESSMENT:   Malnutrition Screening Tool, Consult Assessment of nutrition requirement/status  ASSESSMENT:   70 y/o F with PMH of arthritis, cervical spondylosis with myelopathy s/p spinal fusion, CKD, AAA, cholecystectomy, HTN, anorexia (since 1998, weighs 60lbs) and COPD who presented to [facility] on 12/4 via EMS with reports of lethargy, poor intake and generalized weakness. Family reports she has been feeling poorly for one week with decreased PO intake, lethargy and weakness. She was seen by Neurology for the same on 11/30 and an MRI was ordered but has not been completed. Theyreport she has not eaten or drank anything in days. Initial ER evaluation was notable for UDS positive for benzodiazepines/opiates. CXR showed a left basilar consolidation worrisome for pneumonia.The patient's husband reports she has had a cough as of late and felt like she  needed to clear secretions but has been unable to cough anything up. He has found her asleep in her chair several times in the last week.She typically walks with a walker but does have known pressure ulcers on spine and buttocks. In the ER, she was placed on BiPAP for respiratory support as well as narcan gtt. Family reports she has a living will and would not want resuscitation.  Pt seen for MST and consult. BMI indicates underweight status. Physical assessment shows severe muscle and severe fat wasting; no edema present at this time. Pt is currently on BiPAP with MVe: 9.1 L/min and is NPO. She is unable to provide information and no family/visitors at bedside. Spoke with Velna Hatchet, CCM NP, prior to pt transfer to [facility] from the ED. It is not known at this time if pt will require TF or if diet will be able to be advanced in 1-2 days. RD will follow-up tomorrow and TF recommendations outlined above. Recommended initial rate for TF will provide 288 kcal, 13 grams of protein, 38 grams of carbohydrate, and 197 mL free water.   Pt with hx of anorexia nervosa and weight has been fairly stable (26.8-29.5 kg) since 08/31/2012. Will continue to monitor weight trends during hospitalization. Pt is at high risk of refeeding d/t hx of eating disorder and based on information italicized above from H&P.   Medications reviewed; PRN IV Zofran.  Labs reviewed; Na: 134 mmol/L, Cl: 94 mmol/L, BUN: 26 mg/dL, lactic acid: 2.1 mmol/L (down from earlier this AM).  IVF: D5-NS @ 50 mL/hr (204 kcal). Drip: narcan @2   mg/hr.   Diet Order:  Diet NPO time specified Except for: Sips with Meds  Skin:  Wound (see comment) (Stage 2 bilateral coccyx pressure injury; L ear DTI (per RN))  Last BM:  PTA/unknown  Height:   Ht Readings from Last 1 Encounters:  01/15/16 4\' 11"  (1.499 m)    Weight:   Wt Readings from Last 1 Encounters:  01/19/16 59 lb 8.4 oz (27 kg)    Ideal Body Weight:  42.91 kg  BMI:  Body mass index is  12.02 kg/m.  Estimated Nutritional Needs:   Kcal:  [phone] (35-40 kcal/kg)  Protein:  41-54 grams (1.5-2 grams/kg)  Fluid:  >/= 1.2 L/day  EDUCATION NEEDS:   No education needs identified at this time    Jarome Matin, MS, RD, LDN, CNSC Inpatient Clinical Dietitian Pager # [phone] After hours/weekend pager # [phone]

## 2016-01-20 ENCOUNTER — Inpatient Hospital Stay (HOSPITAL_COMMUNITY): Payer: Medicare Other

## 2016-01-20 DIAGNOSIS — T424X4D Poisoning by benzodiazepines, undetermined, subsequent encounter: Secondary | ICD-10-CM

## 2016-01-20 DIAGNOSIS — R6510 Systemic inflammatory response syndrome (SIRS) of non-infectious origin without acute organ dysfunction: Secondary | ICD-10-CM

## 2016-01-20 LAB — LEGIONELLA PNEUMOPHILA SEROGP 1 UR AG: L. pneumophila Serogp 1 Ur Ag: NEGATIVE

## 2016-01-20 LAB — CBC
HEMATOCRIT: 45.5 % (ref 36.0–46.0)
HEMOGLOBIN: 14.8 g/dL (ref 12.0–15.0)
MCH: 29.7 pg (ref 26.0–34.0)
MCHC: 32.5 g/dL (ref 30.0–36.0)
MCV: 91.4 fL (ref 78.0–100.0)
Platelets: 184 10*3/uL (ref 150–400)
RBC: 4.98 MIL/uL (ref 3.87–5.11)
RDW: 14.3 % (ref 11.5–15.5)
WBC: 29 10*3/uL — ABNORMAL HIGH (ref 4.0–10.5)

## 2016-01-20 LAB — BASIC METABOLIC PANEL
Anion gap: 9 (ref 5–15)
BUN: 16 mg/dL (ref 6–20)
CHLORIDE: 96 mmol/L — AB (ref 101–111)
CO2: 26 mmol/L (ref 22–32)
CREATININE: 0.57 mg/dL (ref 0.44–1.00)
Calcium: 9.2 mg/dL (ref 8.9–10.3)
GFR calc non Af Amer: >60 mL/min (ref >60)
GLUCOSE: 113 mg/dL — AB (ref 65–99)
Potassium: 3.4 mmol/L — ABNORMAL LOW (ref 3.5–5.1)
Sodium: 131 mmol/L — ABNORMAL LOW (ref 135–145)

## 2016-01-20 LAB — PHOSPHORUS: PHOSPHORUS: 2.6 mg/dL (ref 2.5–4.6)

## 2016-01-20 LAB — CBC WITH DIFFERENTIAL/PLATELET
BASOS PCT: 0 %
Basophils Absolute: 0 10*3/uL (ref 0.0–0.1)
EOS ABS: 0 10*3/uL (ref 0.0–0.7)
EOS PCT: 0 %
HCT: 45.9 % (ref 36.0–46.0)
HEMOGLOBIN: 15.3 g/dL — AB (ref 12.0–15.0)
LYMPHS PCT: 3 %
Lymphs Abs: 0.8 10*3/uL (ref 0.7–4.0)
MCH: 30.1 pg (ref 26.0–34.0)
MCHC: 33.3 g/dL (ref 30.0–36.0)
MCV: 90.4 fL (ref 78.0–100.0)
Monocytes Absolute: 1.1 10*3/uL — ABNORMAL HIGH (ref 0.1–1.0)
Monocytes Relative: 4 %
NEUTROS PCT: 93 %
Neutro Abs: 26.3 10*3/uL — ABNORMAL HIGH (ref 1.7–7.7)
Platelets: 194 10*3/uL (ref 150–400)
RBC: 5.08 MIL/uL (ref 3.87–5.11)
RDW: 14.1 % (ref 11.5–15.5)
WBC: 28.2 10*3/uL — ABNORMAL HIGH (ref 4.0–10.5)

## 2016-01-20 LAB — PROCALCITONIN: PROCALCITONIN: 0.38 ng/mL

## 2016-01-20 LAB — VANCOMYCIN, RANDOM: VANCOMYCIN RM: 9

## 2016-01-20 LAB — MAGNESIUM: Magnesium: 1.4 mg/dL — ABNORMAL LOW (ref 1.7–2.4)

## 2016-01-20 MED ORDER — MAGNESIUM SULFATE 4 GM/100ML IV SOLN
4.0000 g | Freq: Once | INTRAVENOUS | Status: AC
  Administered 2016-01-20: 4 g via INTRAVENOUS
  Filled 2016-01-20: qty 100

## 2016-01-20 MED ORDER — VANCOMYCIN HCL 500 MG IV SOLR
500.0000 mg | Freq: Once | INTRAVENOUS | Status: AC
  Administered 2016-01-20: 500 mg via INTRAVENOUS
  Filled 2016-01-20: qty 500

## 2016-01-20 MED ORDER — POTASSIUM CHLORIDE CRYS ER 20 MEQ PO TBCR
40.0000 meq | EXTENDED_RELEASE_TABLET | Freq: Once | ORAL | Status: AC
  Administered 2016-01-20: 40 meq via ORAL
  Filled 2016-01-20: qty 2

## 2016-01-20 NOTE — Progress Notes (Signed)
NP made aware of low urine output. No new orders. Will continue to monitor pt closely.

## 2016-01-20 NOTE — Progress Notes (Addendum)
PROGRESS NOTE                                                                                                                                                                                                             Patient Demographics:    Regina [name], is a 70 y.o. female, DOB - [birth year], UD:[id number]  Admit date - 01/19/2016   Admitting Physician Rush Landmark, MD  Outpatient Primary MD for the patient is Stephens Shire, MD  LOS - 1  Outpatient Specialists:Dr. Melvyn Novas  Chief Complaint  Patient presents with  . 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 As per her family patient eating very poorly with increased weakness and lethargic. She was seen by neurologist on 11/30 and an MRI of the brain was ordered but has not been done yet. Patient stopped eating or drinking for several days. Family doubt patient was taking excess of her pain medications and benzodiazepine. She was having nonproductive cough for last few days. At baseline she embolus with a walker. In the ED she was found to be in acute hypoxic respiratory failure, early sepsis with WBC of 21.9K, hemoglobin of 17 and lactic acid of 3.32. Blood glucose is 176. Troponin of 0.06. Chest x-ray showed left basilar infiltrate concerning for pneumonia. ABG showed pH of 7.207, PCO2 of 70 and PO2 of 281 In the ED she was she was placed on BiPAP and started on Narcan drip. Se[facility] by [facility] CM and admitted to ICU. Transfer to hospitalist service next day.   Subjective:   Patient on BiPAP during exam this morning. Answering to questions.   Assessment  & Plan  :   Rinse about problem Acute hypercapnic respiratory failure Likely secondary to aspiration pneumonia, COPD and effect of narcotics/benzodiazepine. Off BiPAP , on 2L nasal cannula.. Off Narcan gtt. Continue empiric antibiotics. Wean off BiPAP to nasal cannula.   Active Problems:   Aspiration pneumonia of left lower lobe (HCC) Empiric IV vancomycin and Zosyn. Check blood culture, urine for strep antigen and  Legionella. Supportive care with Tylenol, antitussives. Incentive spirometry and nebs.Marland Kitchen  SIRS ([place]) Secondary to lobar pneumonia. Resolving. Lactic acid normalized. Normal TONE in. Continue empiric antibiotics. Follow urine and blood culture.    Acute encephalopathy secondary to Benzodiazepine overdose of undetermined intent/ Opioid overdose On chronic pain medications for cervical spondylosis and arthritis with cervical fusion. Family deny that patient was overdosing on her narcotic or benzodiazepine. However patient is also severely malnourished and underweight. Hold narcotics for now. We need to adjust dosing as outpatient. I think she would benefit from a much lower dose of narcotic and benzodiazepine and add NSAIDs.  COPD Continue when necessary nebs. Ongoing tobacco use. Needs counseling.  Severe protein calorie malnutrition and failure to thrive/anorexia Secondary to poor by mouth intake . Nutrition and PT consult. May need to add appetite stimulant.  Hypokalemia/hypomagnesemia Replenished.  UTI Follow urine culture. On empiric antibiotics.  Essential hypertension   hold home medications.  Chronic kidney disease Stable.  Sacral Decubitus ulcer Wound care consult appreciated.   Code Status : DO NOT RESUSCITATE  Family Communication  : Husband and sister at bedside  Disposition Plan  : Continue step down monitoring. Pending hospital workup.  Barriers For Discharge : Active symptoms  Consults  :  PC CM  Procedures  : None  DVT Prophylaxis  :   Heparin  Lab Results  Component Value Date   PLT 184 01/20/2016    Antibiotics  :    Anti-infectives    Start     Dose/Rate Route Frequency Ordered Stop   01/20/16 0600  vancomycin (VANCOCIN) 500 mg in sodium chloride 0.9 % 100 mL IVPB     500 mg 100 mL/hr over 60 Minutes Intravenous  Once 01/20/16 0502 01/20/16 0703   01/19/16 1400  piperacillin-tazobactam (ZOSYN) IVPB 2.25 g     2.25 g 100 mL/hr over 30 Minutes Intravenous Every 8 hours 01/19/16 1043     01/19/16 0545  vancomycin (VANCOCIN) IVPB 1000 mg/200 mL premix     1,000 mg 200 mL/hr over 60 Minutes Intravenous  Once 01/19/16 0539 01/19/16 0904   01/19/16 0545  piperacillin-tazobactam (ZOSYN) IVPB 3.375 g     3.375 g 100 mL/hr over 30 Minutes Intravenous  Once 01/19/16 0539 01/19/16 0655        Objective:   Vitals:   01/20/16 1143 01/20/16 1200 01/20/16 1300 01/20/16 1400  BP:  (!) 115/54 102/67 (!) 116/53  Pulse:      Resp:  (!) 24 (!) 24 (!) 24  Temp:  99.3 F (37.4 C) 99.1 F (37.3 C) 98.8 F (37.1 C)  TempSrc:      SpO2: 97% 98% 96%   Weight:      Height:        Wt Readings from Last 3 Encounters:  01/20/16 30.2 kg (66 lb 9.3 oz)  01/15/16 27.2 kg (60 lb)  08/29/15 29.5 kg (65 lb)     Intake/Output Summary (Last 24 hours) at 01/20/16 1556 Last data filed at 01/20/16 0900  Gross per 24 hour  Intake             1000 ml  Output              265 ml  Net              735 ml     Physical Exam  Gen: Elderly thin built female on BiPAP HEENT: Temporal wasting, supple neck Chest: Coarse breath sounds over lung base CVS:  N S1&S2, no murmurs, rubs or gallop GI: soft, NT, ND, BS+ Musculoskeletal: warm, no edema CNS: Alert and awake,    Data Review:    CBC  Recent Labs Lab 01/19/16 0500 01/19/16 0510 01/19/16 1527 01/20/16 0331  WBC 21.9*  --  28.2* 29.0*  HGB 17.2* 19.7* 15.3* 14.8  HCT 51.6* 58.0* 45.9 45.5  PLT 255  --  194 184  MCV 90.5  --  90.4 91.4  MCH 30.2  --  30.1 29.7   MCHC 33.3  --  33.3 32.5  RDW 14.3  --  14.1 14.3  LYMPHSABS 0.5*  --  0.8  --   MONOABS 1.4*  --  1.1*  --   EOSABS 0.0  --  0.0  --   BASOSABS 0.0  --  0.0  --     Chemistries   Recent Labs Lab 01/19/16 0500 01/19/16 0510 01/20/16 0331  NA 131* 134* 131*  K 3.8 3.8 3.4*  CL 93* 94* 96*  CO2 27  --  26  GLUCOSE 187* 176* 113*  BUN 26* 26* 16  CREATININE 0.70 0.80 0.57  CALCIUM 10.8*  --  9.2  MG  --   --  1.4*  AST 32  --   --   ALT 27  --   --   ALKPHOS 88  --   --   BILITOT 1.0  --   --    ------------------------------------------------------------------------------------------------------------------ No results for input(s): CHOL, HDL, LDLCALC, TRIG, CHOLHDL, LDLDIRECT in the last 72 hours.  No results found for: HGBA1C ------------------------------------------------------------------------------------------------------------------ No results for input(s): TSH, T4TOTAL, T3FREE, THYROIDAB in the last 72 hours.  Invalid input(s): FREET3 ------------------------------------------------------------------------------------------------------------------ No results for input(s): VITAMINB12, FOLATE, FERRITIN, TIBC, IRON, RETICCTPCT in the last 72 hours.  Coagulation profile No results for input(s): INR, PROTIME in the last 168 hours.  No results for input(s): DDIMER in the last 72 hours.  Cardiac Enzymes No results for input(s): CKMB, TROPONINI, MYOGLOBIN in the last 168 hours.  Invalid input(s): CK ------------------------------------------------------------------------------------------------------------------ No results found for: BNP  Inpatient Medications  Scheduled Meds: . budesonide (PULMICORT) nebulizer solution  0.5 mg Nebulization BID  . heparin subcutaneous  2,500 Units Subcutaneous Q12H  . ipratropium-albuterol  3 mL Nebulization QID  . piperacillin-tazobactam (ZOSYN)  IV  2.25 g Intravenous Q8H   Continuous Infusions: . dextrose 5 % and 0.9%  NaCl 50 mL/hr at 01/20/16 0953  . naLOXone [facility]) adult infusion for OVERDOSE Stopped (01/19/16 1200)   PRN Meds:.sodium chloride, acetaminophen, albuterol, ondansetron (ZOFRAN) IV  Micro Results Recent Results (from the past 240 hour(s))  MRSA PCR Screening     Status: None   Collection Time: 01/19/16  4:43 AM  Result Value Ref Range Status   MRSA by PCR NEGATIVE NEGATIVE Final    Comment:        The GeneXpert MRSA Assay (FDA approved for NASAL specimens only), is one component of a comprehensive MRSA colonization surveillance program. It is not intended to diagnose MRSA infection nor to guide or monitor treatment for MRSA infections.   Culture, blood (routine x 2)     Status: None (Preliminary result)   Collection Time: 01/19/16  6:00 AM  Result Value Ref Range Status   Specimen Description BLOOD LEFT HAND  Final   Special Requests IN PEDIATRIC BOTTLE 1CC  Final   Culture   Final    NO GROWTH 1 DAY Performed at [facility]    Report Status PENDING  Incomplete  Radiology Reports Dg Chest Port 1 View  Result Date: 01/20/2016 CLINICAL DATA:  Acute respiratory failure with hypoxia and hypercapnia. Left lower lobe aspiration pneumonia. COPD. EXAM: PORTABLE CHEST 1 VIEW COMPARISON:  01/19/2016 FINDINGS: Marked pulmonary hyperinflation again seen, consistent with COPD. Mild left lower lobe airspace disease shows no significant change. Right lung remains clear. No evidence of pneumothorax or pleural effusion. Heart size is within normal limits. Aortic atherosclerosis. IMPRESSION: Mild left lower lobe airspace disease, without significant change. COPD. Electronically Signed   By: Earle Gell M.D.   On: 01/20/2016 07:14   Dg Chest Portable 1 View  Result Date: 01/19/2016 CLINICAL DATA:  Worsening weakness and lethargy.  Onset 5 days ago. EXAM: PORTABLE CHEST 1 VIEW COMPARISON:  10/14/2014 FINDINGS: New airspace consolidation in the left base, possibly pneumonia. The right  lung is clear. Mild chronic interstitial coarsening. Heart size is normal. Hilar and mediastinal contours are unremarkable and unchanged. Pulmonary vasculature is normal. IMPRESSION: Left base consolidation, suspicious for pneumonia. Followup PA and lateral chest X-ray is recommended in 3-4 weeks following trial of antibiotic therapy to ensure resolution and exclude underlying malignancy. Electronically Signed   By: Andreas Newport M.D.   On: 01/19/2016 05:19    Time Spent in minutes  35   Louellen Molder M.D on 01/20/2016 at 3:56 PM  Between 7am to 7pm - Pager - [phone]  After 7pm go to [email or web address] - password [facility]  [facility] -  Office  [phone]

## 2016-01-20 NOTE — Progress Notes (Signed)
Pharmacy Antibiotic Note  Regina [name] is a 70 y.o. female admitted on 01/19/2016 with pneumonia.  Pharmacy has been consulted for vancomycin and zosyn dosing.  Plan:  Zosyn 3.375g IV x 1 given in ED - given small weight, will continue with reduced dose of 2.25g IV q8h  Random Vanc = 9 mg/L after x1 dose of 1Gm 12/4  Will redose with 500mg  Vancomycin x1 now  F/u Scr/additional levels  Height: 4\' 11"  (149.9 cm) Weight: 59 lb 8.4 oz (27 kg) IBW/kg (Calculated) : 43.2  Temp (24hrs), Avg:97.8 F (36.6 C), Min:96.1 F (35.6 C), Max:99.1 F (37.3 C)   Recent Labs Lab 01/19/16 0500 01/19/16 0510 01/19/16 0622 01/19/16 1012 01/19/16 1401 01/19/16 1527 01/20/16 0331  WBC 21.9*  --   --   --   --  28.2* 29.0*  CREATININE 0.70 0.80  --   --   --   --  0.57  LATICACIDVEN  --   --  3.32* 2.1* 1.9  --   --   VANCORANDOM  --   --   --   --   --   --  9    Estimated Creatinine Clearance: 27.9 mL/min (by C-G formula based on SCr of 0.57 mg/dL).    Allergies  Allergen Reactions  . Azithromycin     rash  . Erythromycin Rash    Antimicrobials this admission:  12/4 Vanc >> 12/4 Zosyn >>  Dose adjustments this admission:  12/5 at 05:00 vancomycin random level = 9  following 1g IV at 06:55 on 12/4  Microbiology results:  12/4 BCx: sent 12/4 Urine strep Ag: 12/4 Urine legionella Ag: Thank you for allowing pharmacy to be a part of this patient's care.  Peggyann Juba, PharmD, BCPS Pager: [phone] 01/20/2016 5:02 AM

## 2016-01-20 NOTE — Progress Notes (Signed)
NUTRITION NOTE  Pt seen for full assessment yesterday. She has been off BiPAP and on [place] since shortly before rounds this AM. Diet advanced from NPO to Dysphagia 3, thin liquids today at 1308. RD will follow-up tomorrow to obtain information about PO intakes and also to obtain information from PTA. Pt with hx of anorexia nervosa since 1998.     Jarome Matin, MS, RD, LDN, [facility] Inpatient Clinical Dietitian Pager # [phone] After hours/weekend pager # [phone]

## 2016-01-20 NOTE — Progress Notes (Signed)
BiPAP mask adjusted, pt. tolerated well, Daughter @ bedside, pt. Tolerating well.

## 2016-01-20 NOTE — Progress Notes (Signed)
Notified MD of decreased UO. Foley leaking, and inserted additional 2cc of water into balloon. Will continue to monitor UO and foley. Will pass on information to day shift nurse.

## 2016-01-21 DIAGNOSIS — R627 Adult failure to thrive: Secondary | ICD-10-CM

## 2016-01-21 LAB — CBC
HEMATOCRIT: 42.1 % (ref 36.0–46.0)
HEMOGLOBIN: 14.2 g/dL (ref 12.0–15.0)
MCH: 30.2 pg (ref 26.0–34.0)
MCHC: 33.7 g/dL (ref 30.0–36.0)
MCV: 89.6 fL (ref 78.0–100.0)
Platelets: 149 10*3/uL — ABNORMAL LOW (ref 150–400)
RBC: 4.7 MIL/uL (ref 3.87–5.11)
RDW: 14.1 % (ref 11.5–15.5)
WBC: 14.9 10*3/uL — AB (ref 4.0–10.5)

## 2016-01-21 LAB — BASIC METABOLIC PANEL
ANION GAP: 9 (ref 5–15)
BUN: 14 mg/dL (ref 6–20)
CO2: 25 mmol/L (ref 22–32)
Calcium: 9.4 mg/dL (ref 8.9–10.3)
Chloride: 101 mmol/L (ref 101–111)
Creatinine, Ser: 0.3 mg/dL — ABNORMAL LOW (ref 0.44–1.00)
GFR calc non Af Amer: >60 mL/min (ref >60)
GLUCOSE: 97 mg/dL (ref 65–99)
POTASSIUM: 3.4 mmol/L — AB (ref 3.5–5.1)
Sodium: 135 mmol/L (ref 135–145)

## 2016-01-21 LAB — PROCALCITONIN: Procalcitonin: 0.2 ng/mL

## 2016-01-21 LAB — VANCOMYCIN, RANDOM: Vancomycin Rm: 5

## 2016-01-21 MED ORDER — ENSURE ENLIVE PO LIQD
237.0000 mL | ORAL | Status: DC
  Administered 2016-01-23 – 2016-01-26 (×4): 237 mL via ORAL

## 2016-01-21 MED ORDER — METHYLPREDNISOLONE SODIUM SUCC 40 MG IJ SOLR
40.0000 mg | Freq: Three times a day (TID) | INTRAMUSCULAR | Status: DC
  Administered 2016-01-21 – 2016-01-23 (×7): 40 mg via INTRAVENOUS
  Filled 2016-01-21 (×7): qty 1

## 2016-01-21 MED ORDER — IPRATROPIUM-ALBUTEROL 0.5-2.5 (3) MG/3ML IN SOLN
3.0000 mL | RESPIRATORY_TRACT | Status: DC | PRN
  Administered 2016-01-21: 3 mL via RESPIRATORY_TRACT
  Filled 2016-01-21: qty 3

## 2016-01-21 MED ORDER — POTASSIUM CHLORIDE CRYS ER 20 MEQ PO TBCR
40.0000 meq | EXTENDED_RELEASE_TABLET | Freq: Once | ORAL | Status: AC
  Administered 2016-01-21: 40 meq via ORAL
  Filled 2016-01-21: qty 2

## 2016-01-21 MED ORDER — BOOST / RESOURCE BREEZE PO LIQD
1.0000 | ORAL | Status: DC
  Administered 2016-01-21: 1 via ORAL

## 2016-01-21 MED ORDER — VANCOMYCIN HCL IN DEXTROSE 750-5 MG/150ML-% IV SOLN
750.0000 mg | Freq: Once | INTRAVENOUS | Status: AC
  Administered 2016-01-21: 750 mg via INTRAVENOUS
  Filled 2016-01-21: qty 150

## 2016-01-21 NOTE — Progress Notes (Signed)
Nutrition Follow-up  DOCUMENTATION CODES:   Severe malnutrition in context of chronic illness, Underweight  INTERVENTION:  - Will order Ensure Enlive once/day, this supplement provides 350 kcal and 20 grams of protein. - Will order Boost Breeze once/day, this supplement provides 250 kcal and 9 grams of protein. - Continue to encourage PO intakes of meals. - Monitor magnesium, potassium, and phosphorus daily for at least 3 days, MD to replete as needed, as pt is at risk for refeeding syndrome given hypokalemia, hypomagnesemia, and low end of normal Phos with D5 provision. - RD will follow-up 12/8.  NUTRITION DIAGNOSIS:   Malnutrition related to chronic illness as evidenced by severe depletion of muscle mass, severe depletion of body fat. -ongoing  GOAL:   Patient will meet greater than or equal to 90% of their needs -unmet  MONITOR:   PO intake, Supplement acceptance, Weight trends, Labs, Skin, I & O's  ASSESSMENT:   70 y/o F with PMH of arthritis, cervical spondylosis with myelopathy s/p spinal fusion, CKD, AAA, cholecystectomy, HTN, anorexia (since 1998, weighs 60lbs) and COPD who presented to [facility] on 12/4 via EMS with reports of lethargy, poor intake and generalized weakness. Family reports she has been feeling poorly for one week with decreased PO intake, lethargy and weakness. She was seen by Neurology for the same on 11/30 and an MRI was ordered but has not been completed. Theyreport she has not eaten or drank anything in days. Initial ER evaluation was notable for UDS positive for benzodiazepines/opiates. CXR showed a left basilar consolidation worrisome for pneumonia.The patient's husband reports she has had a cough as of late and felt like she needed to clear secretions but has been unable to cough anything up. He has found her asleep in her chair several times in the last week.She typically walks with a walker but does have known pressure ulcers on spine and buttocks. In the  ER, she was placed on BiPAP for respiratory support as well as narcan gtt. Family reports she has a living will and would not want resuscitation.  12/6 Per chart review, pt consumed 33% of lunch yesterday (~273 kcal and 13 grams of protein) and 20% of breakfast this AM. Husband, who is at bedside reports pt ate nearly 100% of oatmeal, 1/3 of biscuit, a few sips of coffee, and a cup of juice. This indicate provides ~260 kcal and 7 grams of protein. He states that this is more than she typically eats at breakfast.  Information obtained from both pt and husband earlier this AM. On a typical day pt's total fluid intake will be 1/3 cup of coffee with unknown amount of vanilla creamer and 3-4 packets of sugar and 1/2 cup of homemade sweet iced tea. Pt does not eat fish but she will eat other seafood, poultry, pork, and beef. Pt denies having any food aversions/any food items that she has negative feelings toward. Husband states that she eats a variety of foods (rather than having "safe" foods that she eats every day), but that she greatly enjoys pasta.   Husband is highly concerned about pt's limited fluid intake and tendency to become dehydrated quickly. Pt reports that she likes soup, but husband states that on the day prior to admission pt asked for soup and a sandwich of which she ate 1 bite of sandwich and refused the soup. Husband states that during that meal pt fell asleep. Pt denies feeling overtly tired during meal times/times of eating.   Weight this AM is +2  kg since admission; will monitor weight trends as pt has been receiving IVF. Will order vanilla Ensure to be used as "creamer" in coffee and recommend Boost Breeze be used when providing PO medications.   Medications reviewed; 4 g IV Mg sulfate x1 dose yesterday, PRN IV Zofran, 40 mEq oral KCl x1 dose today.  Labs reviewed; K: 3.4 mmol/L, Mg: 1.4 mg/dL, Phos: 2.6 mg/dL, creatinine: 0.3 mg/dL.  IVF: D5-NS @ 50 mL/hr (204 kcal).    12/5 -  She has been off BiPAP and on [place] since shortly before rounds this AM.  - Diet advanced from NPO to Dysphagia 3, thin liquids today at 1308.  - Pt with hx of anorexia nervosa since 1998.  12/4 - Physical assessment shows severe muscle and severe fat wasting; no edema present at this time.  - Pt is currently on BiPAP with MVe: 9.1 L/min and is NPO.  - She is unable to provide information and no family/visitors at bedside.  - It is not known at this time if pt will require TF or if diet will be able to be advanced in 1-2 days - If pt requires TF, recommend Osmolite 1.2 @ 10 mL/hr and advance by 5 mL every 24 hours to reach goal rate of Osmolite 1.2 @ 35 mL/hr. At goal rate, this regimen will provide 1008 kcal, 47 grams of protein, 132 grams of carbohydrate, and 689 mL free water. - Recommended initial rate for TF will provide 288 kcal, 13 grams of protein, 38 grams of carbohydrate, and 197 mL free water.  - Pt with hx of anorexia nervosa and weight has been fairly stable (26.8-29.5 kg) since 08/31/2012.  - Pt is at high risk of refeeding if TF provided d/t hx of eating disorder and based on information italicized above from H&P.   IVF: D5-NS @ 50 mL/hr (204 kcal). Drip: narcan @2  mg/hr.    Diet Order:  DIET DYS 3 Room service appropriate? Yes; Fluid consistency: Thin  Skin:  Wound (see comment) (Stage 2 bilateral coccyx pressure injury; L ear DTI (per RN))  Last BM:  12/4  Height:   Ht Readings from Last 1 Encounters:  01/19/16 4\' 11"  (1.499 m)    Weight:   Wt Readings from Last 1 Encounters:  01/21/16 63 lb 14.9 oz (29 kg)    Ideal Body Weight:  42.91 kg  BMI:  Body mass index is 12.91 kg/m.  Estimated Nutritional Needs:   Kcal:  [phone] (35-40 kcal/kg)  Protein:  41-54 grams (1.5-2 grams/kg)  Fluid:  >/= 1.2 L/day  EDUCATION NEEDS:   No education needs identified at this time    Jarome Matin, MS, RD, LDN, CNSC Inpatient Clinical Dietitian Pager #  [phone] After hours/weekend pager # [phone]

## 2016-01-21 NOTE — Progress Notes (Signed)
Dr Wyline Copas aware of output 100cc at 1000 and 110cc at 1350. Foley d/c per order .

## 2016-01-21 NOTE — Progress Notes (Signed)
Patient requests foley to be left in.States she is incontinent .

## 2016-01-21 NOTE — Progress Notes (Signed)
Pharmacy Antibiotic Note  Regina [name] is a 70 y.o. female admitted on 01/19/2016 with pneumonia.  Pharmacy has been consulted for vancomycin and zosyn dosing.  Plan:  Zosyn 3.375g IV x 1 given in ED - given small weight, will continue with reduced dose of 2.25g IV q8h  Random Vanc = 5 mg/L after 500 mg x1 12/5  Will redose with 750 mg Vancomycin x1 now  F/u Scr/additional levels  Height: 4\' 11"  (149.9 cm) Weight: 66 lb 9.3 oz (30.2 kg) IBW/kg (Calculated) : 43.2  Temp (24hrs), Avg:98.7 F (37.1 C), Min:97.9 F (36.6 C), Max:99.9 F (37.7 C)   Recent Labs Lab 01/19/16 0500 01/19/16 0510 01/19/16 0622 01/19/16 1012 01/19/16 1401 01/19/16 1527 01/20/16 0331 01/21/16 0317 01/21/16 0325  WBC 21.9*  --   --   --   --  28.2* 29.0* 14.9*  --   CREATININE 0.70 0.80  --   --   --   --  0.57 0.30*  --   LATICACIDVEN  --   --  3.32* 2.1* 1.9  --   --   --   --   VANCORANDOM  --   --   --   --   --   --  9  --  5    Estimated Creatinine Clearance: 31.2 mL/min (by C-G formula based on SCr of 0.3 mg/dL (L)).    Allergies  Allergen Reactions  . Azithromycin     rash  . Erythromycin Rash    Antimicrobials this admission:  12/4 Vanc >> 12/4 Zosyn >>  Dose adjustments this admission:  12/5 at 0500 vancomycin random level = 9  following 1g IV at 06:55 on 12/4 12/6 at 0325 vancomycin random level = 5 following 500 mg IV at  [place] 12/5  Microbiology results:  12/4 BCx: sent 12/4 Urine strep Ag: 12/4 Urine legionella Ag: Thank you for allowing pharmacy to be a part of this patient's care.   Dorrene German 01/21/2016 4:19 AM

## 2016-01-21 NOTE — Progress Notes (Signed)
Changed Nebulizers to PRN per patients request.

## 2016-01-21 NOTE — Progress Notes (Signed)
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 As per her family patient eating very poorly with increased weakness and lethargic. She was seen by neurologist on 11/30 and an MRI of the brain was ordered but has not been done yet. Patient stopped eating or drinking for several days. Family doubt patient was taking excess of her pain medications and benzodiazepine. She was having nonproductive cough for last few days. At baseline she embolus with a walker. In the ED she was found to be in acute hypoxic respiratory failure, early sepsis with WBC of 21.9K, hemoglobin of 17 and lactic acid of 3.32. Blood glucose is 176. Troponin of 0.06. Chest x-ray showed left basilar infiltrate concerning for pneumonia. ABG showed pH of 7.207, PCO2 of 70 and PO2 of 281 In the ED she was she was placed on BiPAP and started on Narcan drip. Seen by [facility] CM and admitted to ICU. Transfer to hospitalist service next day.  Assessment & Plan:   Active Problems:   Aspiration pneumonia of left lower lobe (HCC)   Pressure injury of skin   Anorexia   Benzodiazepine overdose of undetermined intent   Opioid overdose   Acute respiratory failure with hypoxia and hypercapnia (HCC)   Failure to thrive (0-17)   Rinse about problem Acute hypercapnic respiratory failure Likely secondary to aspiration pneumonia, COPD and effect of narcotics/benzodiazepine. - had required narcan gtt, now off - Patient initially bipap dependent, no weaned to 2L [place] - Patient initially on empiric abx. See below   Active Problems:   Aspiration pneumonia of  left lower lobe (HCC) with sepsis present on admission - Patient was started on empiric IV vancomycin and Zosyn initially.  - Blood culture, urine for strep antigen and Legionella negative thus far - MRSA swab is neg, thus low likelihood for MRSA pna. Will d/c Vanc.    SIRS ([place]) Secondary to lobar pneumonia. -Sepsis physiology resolving. Lactic acid normalized.  -Continue zosyn monotherapy per above    Acute encephalopathy secondary to Benzodiazepine overdose of undetermined intent/ Opioid overdose - Patient had been on chronic pain medications for cervical spondylosis and arthritis with cervical fusion. - Family deny that patient was overdosing on her narcotic or benzodiazepine. However patient is also severely malnourished and underweight. - Holding sedating medications for now. Patient would likely benefit from lower doses of pain medications at time of discharge - Stable at present  COPD - Continue when necessary nebs.  - No wheezing on exam this AM - CXR reviewed. Hyperinflated lungs on chest Xray in setting of continued smoking - On 3LNC this AM, increased to 4LNC this afternoon. Will start solumedrol  Severe protein calorie malnutrition and failure to thrive/anorexia - Secondary to poor PO intake - Consulted Nutrition for dietary recommendations.   Hypokalemia/hypomagnesemia - replaced  Initially thought UTI, ruled out - Reviewed presenting UA - Mod blood, but no leukocytes, no nitrites, only 0-5 WBC's - Many bacteria on UA, likely asymptomatic bacturia   Essential hypertension  - Presently stable and controlled off home BP meds - Monitor for now  Chronic kidney disease - Labs reviewed. - Renal function stable - Repeat  bmet in AM  Sacral Decubitus ulcer - WOC consulted  Documented bloody stool - Hemoglobin has remained stable - Per staff, no further episodes - Repeat CBC in AM  DVT prophylaxis: Heparin subQ Code Status: DNR Family Communication:  Pt in room, husband at bedside Disposition Plan: Uncertain at this time  Consultants:     Procedures:     Antimicrobials: Anti-infectives    Start     Dose/Rate Route Frequency Ordered Stop   01/21/16 0600  vancomycin (VANCOCIN) IVPB 750 mg/150 ml premix     750 mg 150 mL/hr over 60 Minutes Intravenous  Once 01/21/16 0418 01/21/16 0636   01/20/16 0600  vancomycin (VANCOCIN) 500 mg in sodium chloride 0.9 % 100 mL IVPB     500 mg 100 mL/hr over 60 Minutes Intravenous  Once 01/20/16 0502 01/20/16 0703   01/19/16 1400  piperacillin-tazobactam (ZOSYN) IVPB 2.25 g     2.25 g 100 mL/hr over 30 Minutes Intravenous Every 8 hours 01/19/16 1043     01/19/16 0545  vancomycin (VANCOCIN) IVPB 1000 mg/200 mL premix     1,000 mg 200 mL/hr over 60 Minutes Intravenous  Once 01/19/16 0539 01/19/16 0904   01/19/16 0545  piperacillin-tazobactam (ZOSYN) IVPB 3.375 g     3.375 g 100 mL/hr over 30 Minutes Intravenous  Once 01/19/16 0539 01/19/16 0655       Subjective: No complaints this AM  Objective: Vitals:   01/21/16 1000 01/21/16 1100 01/21/16 1158 01/21/16 1200  BP: 110/64   119/76  Pulse:      Resp: 18  (!) 22 (!) 24  Temp: 97.5 F (36.4 C)  97.9 F (36.6 C) 97.7 F (36.5 C)  TempSrc:  Core (Comment)    SpO2: 94%  95% 92%  Weight:      Height:        Intake/Output Summary (Last 24 hours) at 01/21/16 1424 Last data filed at 01/21/16 1359  Gross per 24 hour  Intake             1710 ml  Output              535 ml  Net             1175 ml   Filed Weights   01/19/16 1112 01/20/16 0500 01/21/16 0500  Weight: 27 kg (59 lb 8.4 oz) 30.2 kg (66 lb 9.3 oz) 29 kg (63 lb 14.9 oz)    Examination:  General exam: Appears calm and comfortable  Respiratory system: Clear to auscultation. Respiratory effort normal. Cardiovascular system: S1 & S2 heard, RRR. Gastrointestinal system: Abdomen is nondistended, soft and nontender. No organomegaly or masses felt. Normal bowel sounds  heard. Central nervous system: Alert and oriented. No focal neurological deficits. Extremities: Symmetric 5 x 5 power. Skin: No rashes, lesions Psychiatry: Judgement and insight appear normal. Mood & affect appropriate.   Data Reviewed: I have personally reviewed following labs and imaging studies  CBC:  Recent Labs Lab 01/19/16 0500 01/19/16 0510 01/19/16 1527 01/20/16 0331 01/21/16 0317  WBC 21.9*  --  28.2* 29.0* 14.9*  NEUTROABS 19.9*  --  26.3*  --   --   HGB 17.2* 19.7* 15.3* 14.8 14.2  HCT 51.6* 58.0* 45.9 45.5 42.1  MCV 90.5  --  90.4 91.4 89.6  PLT 255  --  194 184 [id number]*   Basic Metabolic Panel:  Recent Labs Lab 01/19/16 0500 01/19/16 0510 01/20/16 0331 01/21/16 0317  NA 131* 134* 131* 135  K 3.8 3.8 3.4* 3.4*  CL 93* 94* 96* 101  CO2 27  --  26 25  GLUCOSE 187* 176* 113* 97  BUN 26* 26* 16 14  CREATININE 0.70 0.80 0.57 0.30*  CALCIUM 10.8*  --  9.2 9.4  MG  --   --  1.4*  --   PHOS  --   --  2.6  --    GFR: Estimated Creatinine Clearance: 30 mL/min (by C-G formula based on SCr of 0.3 mg/dL (L)). Liver Function Tests:  Recent Labs Lab 01/19/16 0500  AST 32  ALT 27  ALKPHOS 88  BILITOT 1.0  PROT 8.3*  ALBUMIN 4.3   No results for input(s): LIPASE, AMYLASE in the last 168 hours. No results for input(s): AMMONIA in the last 168 hours. Coagulation Profile: No results for input(s): INR, PROTIME in the last 168 hours. Cardiac Enzymes: No results for input(s): CKTOTAL, CKMB, CKMBINDEX, TROPONINI in the last 168 hours. BNP (last 3 results) No results for input(s): PROBNP in the last 8760 hours. HbA1C: No results for input(s): HGBA1C in the last 72 hours. CBG:  Recent Labs Lab 01/19/16 0534  GLUCAP 155*   Lipid Profile: No results for input(s): CHOL, HDL, LDLCALC, TRIG, CHOLHDL, LDLDIRECT in the last 72 hours. Thyroid Function Tests: No results for input(s): TSH, T4TOTAL, FREET4, T3FREE, THYROIDAB in the last 72 hours. Anemia Panel: No  results for input(s): VITAMINB12, FOLATE, FERRITIN, TIBC, IRON, RETICCTPCT in the last 72 hours. Sepsis Labs:  Recent Labs Lab 01/19/16 0622 01/19/16 1012 01/19/16 1401 01/19/16 1527 01/20/16 0331 01/21/16 0317  PROCALCITON  --  0.20  --  0.36 0.38 0.20  LATICACIDVEN 3.32* 2.1* 1.9  --   --   --     Recent Results (from the past 240 hour(s))  MRSA PCR Screening     Status: None   Collection Time: 01/19/16  4:43 AM  Result Value Ref Range Status   MRSA by PCR NEGATIVE NEGATIVE Final    Comment:        The GeneXpert MRSA Assay (FDA approved for NASAL specimens only), is one component of a comprehensive MRSA colonization surveillance program. It is not intended to diagnose MRSA infection nor to guide or monitor treatment for MRSA infections.   Culture, blood (routine x 2)     Status: None (Preliminary result)   Collection Time: 01/19/16  6:00 AM  Result Value Ref Range Status   Specimen Description BLOOD LEFT HAND  Final   Special Requests IN PEDIATRIC BOTTLE [place]  Final   Culture   Final    NO GROWTH 1 DAY Performed at [facility]    Report Status PENDING  Incomplete     Radiology Studies: Dg Chest Port 1 View  Result Date: 01/20/2016 CLINICAL DATA:  Acute respiratory failure with hypoxia and hypercapnia. Left lower lobe aspiration pneumonia. COPD. EXAM: PORTABLE CHEST 1 VIEW COMPARISON:  01/19/2016 FINDINGS: Marked pulmonary hyperinflation again seen, consistent with COPD. Mild left lower lobe airspace disease shows no significant change. Right lung remains clear. No evidence of pneumothorax or pleural effusion. Heart size is within normal limits. Aortic atherosclerosis. IMPRESSION: Mild left lower lobe airspace disease, without significant change. COPD. Electronically Signed   By: Earle Gell M.D.   On: 01/20/2016 07:14    Scheduled Meds: . feeding supplement  1 Container Oral Q24H  . feeding supplement (ENSURE ENLIVE)  237 mL Oral Q24H  . heparin  subcutaneous  2,500 Units Subcutaneous Q12H  .  piperacillin-tazobactam (ZOSYN)  IV  2.25 g Intravenous Q8H   Continuous Infusions: . dextrose 5 % and 0.9% NaCl 50 mL/hr at 01/21/16 1200  . naLOXone [facility]) adult infusion for OVERDOSE Stopped (01/19/16 1200)     LOS: 2 days   CHIU, Orpah Melter, MD [facility] Pager [phone]  If 7PM-7AM, please contact night-coverage [email or web address] Password [email or web address] 01/21/2016, 2:24 PM

## 2016-01-21 NOTE — Progress Notes (Signed)
Refuses to wear Prevalon boots; says they are "painful".

## 2016-01-22 DIAGNOSIS — R630 Anorexia: Secondary | ICD-10-CM

## 2016-01-22 LAB — MAGNESIUM: MAGNESIUM: 1.8 mg/dL (ref 1.7–2.4)

## 2016-01-22 LAB — CBC
HCT: 41.5 % (ref 36.0–46.0)
Hemoglobin: 13.7 g/dL (ref 12.0–15.0)
MCH: 29.1 pg (ref 26.0–34.0)
MCHC: 33 g/dL (ref 30.0–36.0)
MCV: 88.1 fL (ref 78.0–100.0)
PLATELETS: 179 10*3/uL (ref 150–400)
RBC: 4.71 MIL/uL (ref 3.87–5.11)
RDW: 13.9 % (ref 11.5–15.5)
WBC: 10.5 10*3/uL (ref 4.0–10.5)

## 2016-01-22 LAB — BASIC METABOLIC PANEL
Anion gap: 5 (ref 5–15)
BUN: 12 mg/dL (ref 6–20)
CALCIUM: 9.1 mg/dL (ref 8.9–10.3)
CO2: 29 mmol/L (ref 22–32)
CREATININE: 0.31 mg/dL — AB (ref 0.44–1.00)
Chloride: 98 mmol/L — ABNORMAL LOW (ref 101–111)
GFR calc Af Amer: >60 mL/min (ref >60)
Glucose, Bld: 139 mg/dL — ABNORMAL HIGH (ref 65–99)
POTASSIUM: 3.6 mmol/L (ref 3.5–5.1)
SODIUM: 132 mmol/L — AB (ref 135–145)

## 2016-01-22 MED ORDER — LEVALBUTEROL HCL 0.63 MG/3ML IN NEBU
0.6300 mg | INHALATION_SOLUTION | Freq: Three times a day (TID) | RESPIRATORY_TRACT | Status: DC
  Administered 2016-01-23 (×2): 0.63 mg via RESPIRATORY_TRACT
  Filled 2016-01-22 (×3): qty 3

## 2016-01-22 MED ORDER — IPRATROPIUM BROMIDE 0.02 % IN SOLN
0.5000 mg | Freq: Three times a day (TID) | RESPIRATORY_TRACT | Status: DC
  Administered 2016-01-23 (×2): 0.5 mg via RESPIRATORY_TRACT
  Filled 2016-01-22 (×3): qty 2.5

## 2016-01-22 MED ORDER — AMOXICILLIN-POT CLAVULANATE 500-125 MG PO TABS
1.0000 | ORAL_TABLET | Freq: Two times a day (BID) | ORAL | Status: DC
  Administered 2016-01-22 – 2016-01-26 (×8): 500 mg via ORAL
  Filled 2016-01-22 (×10): qty 1

## 2016-01-22 MED ORDER — IPRATROPIUM-ALBUTEROL 0.5-2.5 (3) MG/3ML IN SOLN: 3.0000 mL | Freq: Four times a day (QID) | RESPIRATORY_TRACT | Status: DC | PRN

## 2016-01-22 MED ORDER — IPRATROPIUM-ALBUTEROL 0.5-2.5 (3) MG/3ML IN SOLN: 3.0000 mL | Freq: Three times a day (TID) | RESPIRATORY_TRACT | Status: DC

## 2016-01-22 MED ORDER — ISOSORBIDE MONONITRATE ER 30 MG PO TB24
30.0000 mg | ORAL_TABLET | Freq: Every day | ORAL | Status: DC
  Administered 2016-01-22 – 2016-01-24 (×3): 30 mg via ORAL
  Filled 2016-01-22 (×3): qty 1

## 2016-01-22 MED ORDER — ALBUTEROL SULFATE (2.5 MG/3ML) 0.083% IN NEBU: 2.5000 mg | INHALATION_SOLUTION | Freq: Four times a day (QID) | RESPIRATORY_TRACT | Status: DC | PRN

## 2016-01-22 MED ORDER — IPRATROPIUM-ALBUTEROL 0.5-2.5 (3) MG/3ML IN SOLN: 3.0000 mL | RESPIRATORY_TRACT | Status: DC | PRN

## 2016-01-22 MED ORDER — LIP MEDEX EX OINT
TOPICAL_OINTMENT | CUTANEOUS | Status: AC
  Administered 2016-01-22: 12:00:00
  Filled 2016-01-22: qty 7

## 2016-01-22 MED ORDER — IPRATROPIUM-ALBUTEROL 0.5-2.5 (3) MG/3ML IN SOLN
3.0000 mL | Freq: Four times a day (QID) | RESPIRATORY_TRACT | Status: DC
  Administered 2016-01-22: 3 mL via RESPIRATORY_TRACT
  Filled 2016-01-22 (×2): qty 3

## 2016-01-22 MED ORDER — LEVALBUTEROL HCL 0.63 MG/3ML IN NEBU: 0.6300 mg | INHALATION_SOLUTION | RESPIRATORY_TRACT | Status: DC | PRN

## 2016-01-22 NOTE — Progress Notes (Signed)
Date:  January 22, 2016 Chart reviewed for concurrent status and case management needs. Will continue to follow patient progress. Transferred to tele bed from sdu. Discharge Planning: following for needs Expected discharge date: [id number] Velva Harman, BSN, [place], [place]

## 2016-01-22 NOTE — Progress Notes (Signed)
PROGRESS NOTE    Regina [name]  [id number] DOB: 1946/01/02 DOA: 01/19/2016 PCP: Stephens Shire, MD    Brief Narrative:  70 year old female with history of cervical spondylosis with myelopathy status post spinal fusion, AAA, hypertension, anorexia, COPD, chronic pain on Tylenol# 3 and benzodiazepine who was brought to [facility] long hospital ED by EMS with lethargic, poor by mouth intake progressive for one week and generalized weakness. As per her family patient eating very poorly with increased weakness and lethargic. She was seen by neurologist on 11/30 and an MRI of the brain was ordered but has not been done yet. Patient stopped eating or drinking for several days. Family doubt patient was taking excess of her pain medications and benzodiazepine. She was having nonproductive cough for last few days. At baseline she embolus with a walker. In the ED she was found to be in acute hypoxic respiratory failure, early sepsis with WBC of 21.9K, hemoglobin of 17 and lactic acid of 3.32. Blood glucose is 176. Troponin of 0.06. Chest x-ray showed left basilar infiltrate concerning for pneumonia. ABG showed pH of 7.207, PCO2 of 70 and PO2 of 281 In the ED she was she was placed on BiPAP and started on Narcan drip. Seen by [facility] CM and admitted to ICU. Transfer to hospitalist service next day.  Assessment & Plan:   Active Problems:   Aspiration pneumonia of left lower lobe (HCC)   Pressure injury of skin   Anorexia   Benzodiazepine overdose of undetermined intent   Opioid overdose   Acute respiratory failure with hypoxia and hypercapnia (HCC)   Failure to thrive (0-17)   Rinse about problem Acute hypercapnic respiratory failure Likely secondary to aspiration pneumonia, COPD and effect of narcotics/benzodiazepine. - had required narcan gtt, now off - Patient initially bipap dependent, now weaned to [place], still at 4L this am - Patient continued on empiric abx per below - Still needing O2   Active  Problems:   Aspiration pneumonia of left lower lobe ([place]) with sepsis present on admission - Patient was started on empiric IV vancomycin and Zosyn initially.  - Blood culture, urine for strep antigen and Legionella negative thus far - MRSA swab is neg, thus low likelihood for MRSA pna, thus d/c'd vancomycin - Discussed with pharmacy. To change to appropriately dosed augmentin today  SIRS ([place]) Secondary to lobar pneumonia. -Sepsis physiology resolved Lactic acid normalized. Leukocytosis normalized -Now on augmentin    Acute encephalopathy secondary to Benzodiazepine overdose of undetermined intent/ Opioid overdose - Patient had been on chronic pain medications for cervical spondylosis and arthritis with cervical fusion. - Family deny that patient was overdosing on her narcotic or benzodiazepine. However patient is also severely malnourished and underweight. - Holding sedating medications for now. Patient would likely benefit from lower doses of pain medications at time of discharge - Assessed. Remains stable today  COPD - Continue when necessary nebs.  - No wheezing on exam this AM, however pt does have diminished breath sounds and increased work of breathing - CXR reviewed. Hyperinflated lungs on chest Xray in setting of continued smoking - On 4LNC this AM. On solumedrol. - Will schedule breathing treatments  Severe protein calorie malnutrition and failure to thrive/anorexia - Secondary to poor PO intake - Consulted Nutrition for dietary recommendations. - Recommendations for Boost   Hypokalemia/hypomagnesemia - replaced - Repeat electrolytes and correct as needed  Initially thought UTI, ruled out - Reviewed UA that was obtained at admission - Mod blood, but no leukocytes, no  nitrites, only 0-5 WBC's - Many bacteria on UA, likely asymptomatic bacturia, not UTI  Essential hypertension  - Thus far remains off home meds - BP this AM elevated - Will resume imdur per  home regimen  Chronic kidney disease - Renal function remains stable this AM - Recheck bmet in AM  Sacral Decubitus ulcer - WOC consulted at admission  Documented bloody stool - Hemoglobin within normal range - No other issues of GI bleeding reported   DVT prophylaxis: Heparin subQ Code Status: DNR Family Communication: Pt in room, husband at bedside Disposition Plan: transfer to med-tele  Consultants:     Procedures:     Antimicrobials: Anti-infectives    Start     Dose/Rate Route Frequency Ordered Stop   01/22/16 1200  amoxicillin-clavulanate (AUGMENTIN) 500-125 MG per tablet 500 mg     1 tablet Oral 2 times daily 01/22/16 0828     01/21/16 0600  vancomycin (VANCOCIN) IVPB 750 mg/150 ml premix     750 mg 150 mL/hr over 60 Minutes Intravenous  Once 01/21/16 0418 01/21/16 0636   01/20/16 0600  vancomycin (VANCOCIN) 500 mg in sodium chloride 0.9 % 100 mL IVPB     500 mg 100 mL/hr over 60 Minutes Intravenous  Once 01/20/16 0502 01/20/16 0703   01/19/16 1400  piperacillin-tazobactam (ZOSYN) IVPB 2.25 g  Status:  Discontinued     2.25 g 100 mL/hr over 30 Minutes Intravenous Every 8 hours 01/19/16 1043 01/22/16 0828   01/19/16 0545  vancomycin (VANCOCIN) IVPB 1000 mg/200 mL premix     1,000 mg 200 mL/hr over 60 Minutes Intravenous  Once 01/19/16 0539 01/19/16 0904   01/19/16 0545  piperacillin-tazobactam (ZOSYN) IVPB 3.375 g     3.375 g 100 mL/hr over 30 Minutes Intravenous  Once 01/19/16 0539 01/19/16 0655      Subjective: Feels somewhat better  Objective: Vitals:   01/22/16 1000 01/22/16 1100 01/22/16 1200 01/22/16 1300  BP: (!) 154/80  (!) 145/103   Pulse:      Resp: (!) 24 (!) 24 18 (!) 22  Temp:   97.9 F (36.6 C)   TempSrc:   Oral   SpO2: 96% 95% 98% 100%  Weight:      Height:        Intake/Output Summary (Last 24 hours) at 01/22/16 1422 Last data filed at 01/22/16 1200  Gross per 24 hour  Intake           1302.5 ml  Output                0 ml    Net           1302.5 ml   Filed Weights   01/20/16 0500 01/21/16 0500 01/22/16 0500  Weight: 30.2 kg (66 lb 9.3 oz) 29 kg (63 lb 14.9 oz) 30 kg (66 lb 2.2 oz)    Examination:  General exam: Laying in bed, in nad Respiratory system: decreased BS throughout, no audible wheezing Cardiovascular system: regular rate, s1-s2 Gastrointestinal system: soft, pos BS, nondistended Central nervous system: CN2-12 grossly intact, strength/sensation intact Extremities: Perfused, no clubbing Skin: normal skin turgor, no pallor Psychiatry: mood normal// no visual hallucinations  Data Reviewed: I have personally reviewed following labs and imaging studies  CBC:  Recent Labs Lab 01/19/16 0500 01/19/16 0510 01/19/16 1527 01/20/16 0331 01/21/16 0317 01/22/16 0321  WBC 21.9*  --  28.2* 29.0* 14.9* 10.5  NEUTROABS 19.9*  --  26.3*  --   --   --  HGB 17.2* 19.7* 15.3* 14.8 14.2 13.7  HCT 51.6* 58.0* 45.9 45.5 42.1 41.5  MCV 90.5  --  90.4 91.4 89.6 88.1  PLT 255  --  194 184 149* 0000000   Basic Metabolic Panel:  Recent Labs Lab 01/19/16 0500 01/19/16 0510 01/20/16 0331 01/21/16 0317 01/22/16 0321  NA 131* 134* 131* 135 132*  K 3.8 3.8 3.4* 3.4* 3.6  CL 93* 94* 96* 101 98*  CO2 27  --  26 25 29   GLUCOSE 187* 176* 113* 97 139*  BUN 26* 26* 16 14 12   CREATININE 0.70 0.80 0.57 0.30* 0.31*  CALCIUM 10.8*  --  9.2 9.4 9.1  MG  --   --  1.4*  --  1.8  PHOS  --   --  2.6  --   --    GFR: Estimated Creatinine Clearance: 31 mL/min (by C-G formula based on SCr of 0.31 mg/dL (L)). Liver Function Tests:  Recent Labs Lab 01/19/16 0500  AST 32  ALT 27  ALKPHOS 88  BILITOT 1.0  PROT 8.3*  ALBUMIN 4.3   No results for input(s): LIPASE, AMYLASE in the last 168 hours. No results for input(s): AMMONIA in the last 168 hours. Coagulation Profile: No results for input(s): INR, PROTIME in the last 168 hours. Cardiac Enzymes: No results for input(s): CKTOTAL, CKMB, CKMBINDEX, TROPONINI in  the last 168 hours. BNP (last 3 results) No results for input(s): PROBNP in the last 8760 hours. HbA1C: No results for input(s): HGBA1C in the last 72 hours. CBG:  Recent Labs Lab 01/19/16 0534  GLUCAP 155*   Lipid Profile: No results for input(s): CHOL, HDL, LDLCALC, TRIG, CHOLHDL, LDLDIRECT in the last 72 hours. Thyroid Function Tests: No results for input(s): TSH, T4TOTAL, FREET4, T3FREE, THYROIDAB in the last 72 hours. Anemia Panel: No results for input(s): VITAMINB12, FOLATE, FERRITIN, TIBC, IRON, RETICCTPCT in the last 72 hours. Sepsis Labs:  Recent Labs Lab 01/19/16 0622 01/19/16 1012 01/19/16 1401 01/19/16 1527 01/20/16 0331 01/21/16 0317  PROCALCITON  --  0.20  --  0.36 0.38 0.20  LATICACIDVEN 3.32* 2.1* 1.9  --   --   --     Recent Results (from the past 240 hour(s))  MRSA PCR Screening     Status: None   Collection Time: 01/19/16  4:43 AM  Result Value Ref Range Status   MRSA by PCR NEGATIVE NEGATIVE Final    Comment:        The GeneXpert MRSA Assay (FDA approved for NASAL specimens only), is one component of a comprehensive MRSA colonization surveillance program. It is not intended to diagnose MRSA infection nor to guide or monitor treatment for MRSA infections.   Culture, blood (routine x 2)     Status: None (Preliminary result)   Collection Time: 01/19/16  6:00 AM  Result Value Ref Range Status   Specimen Description BLOOD LEFT HAND  Final   Special Requests IN PEDIATRIC BOTTLE [place]  Final   Culture   Final    NO GROWTH 3 DAYS Performed at [facility]    Report Status PENDING  Incomplete     Radiology Studies: No results found.  Scheduled Meds: . amoxicillin-clavulanate  1 tablet Oral BID  . feeding supplement  1 Container Oral Q24H  . feeding supplement (ENSURE ENLIVE)  237 mL Oral Q24H  . heparin subcutaneous  2,500 Units Subcutaneous Q12H  . ipratropium-albuterol  3 mL Nebulization Q6H  . isosorbide mononitrate  30 mg Oral  Daily  . methylPREDNISolone (SOLU-MEDROL) injection  40 mg Intravenous Q8H   Continuous Infusions: . dextrose 5 % and 0.9% NaCl 50 mL/hr at 01/22/16 1200  . naLOXone [facility]) adult infusion for OVERDOSE Stopped (01/19/16 1200)     LOS: 3 days   CHIU, Orpah Melter, MD [facility] Pager [phone]  If 7PM-7AM, please contact night-coverage [email or web address] Password [email or web address] 01/22/2016, 2:22 PM

## 2016-01-22 NOTE — Progress Notes (Signed)
Breathing treatment stopped due to patients heart rate jumping to 157. HR bouncing around at this moment. RN notified.

## 2016-01-22 NOTE — Evaluation (Signed)
Physical Therapy Evaluation Patient Details Name: Regina [name] MRN: [id number] DOB: Dec 28, 1945 Today's Date: 01/22/2016   History of Present Illness  70 y/o F with PMH of arthritis, cervical spondylosis with myelopathy s/p spinal fusion, CKD, AAA, cholecystectomy, HTN, anorexia  and COPD who presented to [facility] on 12/4 via EMS with reports of lethargy, poor intake and generalized weakness  Clinical Impression  The patient requires total assistance for mobility and balance sitting at bed edge. HR 110-156, RR 24-35, O2 saturation on 4 liters [place] a>90%. RN aware.Noted congestion and weak cough. The spouse indicated that return home would require extra help from out side. The patient was ambulatory 1 week ago. Pt admitted with above diagnosis. Pt currently with functional limitations due to the deficits listed below (see PT Problem List).  Pt will benefit from skilled PT to increase their independence and safety with mobility to allow discharge to the venue listed below.       Follow Up Recommendations SNF;Supervision/Assistance - 24 hour;Home health PT (spouse states that he would need to hire assistants  to DC to home.)    Equipment Recommendations  Wheelchair (measurements PT);Hospital bed    Recommendations for Other Services       Precautions / Restrictions Precautions Precautions: Fall Precaution Comments: multiple skin breakdown sites, do not "pull on the neck"- fused.      Mobility  Bed Mobility Overal bed mobility: Needs Assistance Bed Mobility: Rolling;Sidelying to Sit;Sit to Supine Rolling: Total assist Sidelying to sit: Total assist Supine to sit: Total assist Sit to supine: Total assist   General bed mobility comments: Bed pad used for rolling, for sitting to upright and assist with trunk and legs back to sidelying. the patient is "floppy".   Transfers                    Ambulation/Gait                Stairs            Wheelchair Mobility     Modified Rankin (Stroke Patients Only)       Balance Overall balance assessment: History of Falls;Needs assistance Sitting-balance support: Feet unsupported;Bilateral upper extremity supported Sitting balance-Leahy Scale: Zero Sitting balance - Comments: did use left  arm to rail to assist balance                                     Pertinent Vitals/Pain Pain Assessment: Faces Faces Pain Scale: Hurts little more Pain Location: neck Pain Descriptors / Indicators: Discomfort;Grimacing;Moaning Pain Intervention(s): Monitored during session;Repositioned    Home Living Family/patient expects to be discharged to:: Private residence Living Arrangements: Spouse/significant other Available Help at Discharge: Family Type of Home: House Home Access: Stairs to enter Entrance Stairs-Rails: Right Entrance Stairs-Number of Steps: 4 Home Layout: One level Home Equipment: Environmental consultant - 2 wheels      Prior Function Level of Independence: Needs assistance   Gait / Transfers Assistance Needed: ambulated in home with/w/o RW.   ADL's / Homemaking Assistance Needed: assist with bath        Hand Dominance        Extremity/Trunk Assessment   Upper Extremity Assessment: Generalized weakness           Lower Extremity Assessment: RLE deficits/detail;LLE deficits/detail RLE Deficits / Details: dorsiflexion2/5, knee extension 2+, hip flex 2/5, decreased dorsiflexion to neutral PROM, LLE Deficits / Details:  similar to right leg  Cervical / Trunk Assessment: Kyphotic;Other exceptions  Communication      Cognition Arousal/Alertness: Awake/alert Behavior During Therapy: Flat affect Overall Cognitive Status: Difficult to assess                 General Comments: patient appears aware of environment.     General Comments      Exercises     Assessment/Plan    PT Assessment Patient needs continued PT services  PT Problem List Decreased strength;Decreased range  of motion;Decreased activity tolerance;Decreased balance;Decreased coordination;Decreased mobility;Cardiopulmonary status limiting activity;Decreased safety awareness;Decreased knowledge of precautions;Decreased knowledge of use of DME;Pain;Decreased skin integrity          PT Treatment Interventions Functional mobility training;Therapeutic exercise;Therapeutic activities;Balance training;Patient/family education    PT Goals (Current goals can be found in the Care Plan section)  Acute Rehab PT Goals Patient Stated Goal: per spouse , to go home PT Goal Formulation: With patient/family Time For Goal Achievement: 02/05/16 Potential to Achieve Goals: Fair    Frequency Min 3X/week   Barriers to discharge        Co-evaluation               End of Session   Activity Tolerance: Patient limited by fatigue;Treatment limited secondary to medical complications (Comment) Patient left: in bed;with call bell/phone within reach;with bed alarm set;with family/visitor present Nurse Communication: Mobility status;Need for lift equipment         Time: 1412-1446 PT Time Calculation (min) (ACUTE ONLY): 34 min   Charges:   PT Evaluation $PT Eval Moderate Complexity: 1 Procedure PT Treatments $Therapeutic Activity: 8-22 mins   PT G Codes:        Claretha Cooper 01/22/2016, 4:24 PM Tresa Endo PT [phone]

## 2016-01-23 DIAGNOSIS — T402X1S Poisoning by other opioids, accidental (unintentional), sequela: Secondary | ICD-10-CM

## 2016-01-23 LAB — CBC
HCT: 40.6 % (ref 36.0–46.0)
HEMOGLOBIN: 13.2 g/dL (ref 12.0–15.0)
MCH: 29.1 pg (ref 26.0–34.0)
MCHC: 32.5 g/dL (ref 30.0–36.0)
MCV: 89.6 fL (ref 78.0–100.0)
Platelets: 203 10*3/uL (ref 150–400)
RBC: 4.53 MIL/uL (ref 3.87–5.11)
RDW: 14.2 % (ref 11.5–15.5)
WBC: 14.6 10*3/uL — ABNORMAL HIGH (ref 4.0–10.5)

## 2016-01-23 LAB — COMPREHENSIVE METABOLIC PANEL
ALBUMIN: 2.5 g/dL — AB (ref 3.5–5.0)
ALK PHOS: 63 U/L (ref 38–126)
ALT: 20 U/L (ref 14–54)
ANION GAP: 4 — AB (ref 5–15)
AST: 21 U/L (ref 15–41)
BUN: 13 mg/dL (ref 6–20)
CALCIUM: 9.4 mg/dL (ref 8.9–10.3)
CO2: 30 mmol/L (ref 22–32)
Chloride: 102 mmol/L (ref 101–111)
Creatinine, Ser: 0.3 mg/dL — ABNORMAL LOW (ref 0.44–1.00)
GFR calc Af Amer: >60 mL/min (ref >60)
GFR calc non Af Amer: >60 mL/min (ref >60)
GLUCOSE: 128 mg/dL — AB (ref 65–99)
Potassium: 3.5 mmol/L (ref 3.5–5.1)
SODIUM: 136 mmol/L (ref 135–145)
Total Bilirubin: 0.5 mg/dL (ref 0.3–1.2)
Total Protein: 5.6 g/dL — ABNORMAL LOW (ref 6.5–8.1)

## 2016-01-23 MED ORDER — METHYLPREDNISOLONE SODIUM SUCC 40 MG IJ SOLR
40.0000 mg | Freq: Two times a day (BID) | INTRAMUSCULAR | Status: DC
  Administered 2016-01-23 – 2016-01-26 (×7): 40 mg via INTRAVENOUS
  Filled 2016-01-23 (×6): qty 1

## 2016-01-23 NOTE — Progress Notes (Signed)
This RN assumed care of this patient at 1530. Agree with previous assessment. Per report, pt has only voided 1 time this shift. Bladder scan performed showing 117 ml. Pt denies discomfort or urge to void. NS infusing at 50 ml/hr. Pt is also edematous on her right labia, and has a small, soft, palpable pocket of fluid on her pelvic area. MD Wyline Copas paged with results & findings.

## 2016-01-23 NOTE — Clinical Social Work Note (Signed)
Clinical Social Work Assessment  Patient Details  Name: Regina [name] MRN: [id number] Date of Birth: [birth year]  Date of referral:  01/23/16               Reason for consult:  Facility Placement                Permission sought to share information with:  Chartered certified accountant granted to share information::  Yes, Verbal Permission Granted  Name::        Agency::     Relationship::     Contact Information:     Housing/Transportation Living arrangements for the past 2 months:  Single Family Home Source of Information:  Patient, Spouse Patient Interpreter Needed:  None Criminal Activity/Legal Involvement Pertinent to Current Situation/Hospitalization:  No - Comment as needed Significant Relationships:  Spouse Lives with:  Spouse Do you feel safe going back to the place where you live?  No Need for family participation in patient care:  Yes (Comment)  Care giving concerns:  CSW reviewed PT evaluation recommending SNF at discharge.    Social Worker assessment / plan:  CSW spoke with patient & husband, Regina [name] at bedside re: discharge planning. Patient is hesitant, but agreeable with plan for SNF.   Employment status:  Retired Forensic scientist:  Medicare PT Recommendations:  [place] / Referral to community resources:  [place]  Patient/Family's Response to care:  Patient requested [name] as her neighbor had been there in the past. CSW confirmed with Regina [name] at [facility] that they would be able to take her today or tomorrow.   Patient/Family's Understanding of and Emotional Response to Diagnosis, Current Treatment, and Prognosis:    Emotional Assessment Appearance:  Appears stated age Attitude/Demeanor/Rapport:    Affect (typically observed):    Orientation:  Oriented to Self, Oriented to Place, Oriented to  Time, Oriented to Situation Alcohol / Substance use:    Psych involvement (Current and /or in the  community):     Discharge Needs  Concerns to be addressed:    Readmission within the last 30 days:    Current discharge risk:    Barriers to Discharge:      Standley Brooking, LCSW 01/23/2016, 12:47 PM

## 2016-01-23 NOTE — Progress Notes (Signed)
PT demonstrated hands on understanding of Flutter device. 

## 2016-01-23 NOTE — Progress Notes (Signed)
Nutrition Follow-up  DOCUMENTATION CODES:   Severe malnutrition in context of chronic illness, Underweight  INTERVENTION:  - Continue Ensure Enlive once/day and Boost Breeze once/day. - Continue to encourage PO intakes as much as possible given PMH (anorexia nervosa since 1998). - RD will continue to monitor for nutrition-related needs if pt unable to d/c over the weekend.  NUTRITION DIAGNOSIS:   Malnutrition related to chronic illness as evidenced by severe depletion of muscle mass, severe depletion of body fat. -ongiong  GOAL:   Patient will meet greater than or equal to 90% of their needs -equates to a minimum of 851 kcal and 37 grams of protein/day; likely met today with PO intakes and kcal from IVF.  MONITOR:   PO intake, Supplement acceptance, Weight trends, Labs, Skin, I & O's  ASSESSMENT:   70 y/o F with PMH of arthritis, cervical spondylosis with myelopathy s/p spinal fusion, CKD, AAA, cholecystectomy, HTN, anorexia (since 1998, weighs 60lbs) and COPD who presented to [facility] on 12/4 via EMS with reports of lethargy, poor intake and generalized weakness. Family reports she has been feeling poorly for one week with decreased PO intake, lethargy and weakness. She was seen by Neurology for the same on 11/30 and an MRI was ordered but has not been completed. Theyreport she has not eaten or drank anything in days. Initial ER evaluation was notable for UDS positive for benzodiazepines/opiates. CXR showed a left basilar consolidation worrisome for pneumonia.The patient's husband reports she has had a cough as of late and felt like she needed to clear secretions but has been unable to cough anything up. He has found her asleep in her chair several times in the last week.She typically walks with a walker but does have known pressure ulcers on spine and buttocks. In the ER, she was placed on BiPAP for respiratory support as well as narcan gtt. Family reports she has a living will and would  not want resuscitation.  12/8 Per chart review, pt consumed 25% of dinner last night which would be ~191 kcal and 7 grams of protein. Husband reports that pt ate 100% of oatmeal with sugar and butter, 1/4 biscuit, 50% of coffee and 50% of orange juice for breakfast. This equates to ~382 kcal and 6.5 grams of protein. Pt also drank almost 100% of Ensure Enlive today (350 kcal and 20 grams of protein). Visualized lunch tray with 1 bite of sandwich and 2-3 bites vanilla ice cream.   Pt sleeping during most of RD visit. She denies abdominal pain or nausea with intakes today. Husband states that pt has not expressed any discomfort or negative emotions with eating yesterday or today and states that she has been eating and drinking more during hospitalization than she did at home. He reports that she was usually drinking Ensure once/day PTA and that she liked it to be 1/2 chocolate flavor and 1/2 vanilla flavor. Husband reports that he was informed by MD that pt is likely to d/c to [facility] for rehab over the weekend. Husband denies any nutrition-related questions or concerns at this time.  Medications reviewed; 40 mg IV Solu-medrol TID, PRN IV Zofran. Labs reviewed; K, Mg, and Phos WDL at this time, creatinine: 0.3 mg/dL.  IVF: D5-NS @ 50 mL/hr (204 kcal).    12/6 - Per chart review, pt consumed 33% of lunch yesterday (~273 kcal and 13 grams of protein) and 20% of breakfast this AM.  - Husband, who is at bedside reports pt ate nearly 100% of oatmeal,  1/3 of biscuit, a few sips of coffee, and a cup of juice.  - This indicate provides ~260 kcal and 7 grams of protein.  - He states that this is more than she typically eats at breakfast. - On a typical day pt's total fluid intake will be 1/3 cup of coffee with unknown amount of vanilla creamer and 3-4 packets of sugar and 1/2 cup of homemade sweet iced tea.  - Pt does not eat fish but she will eat other seafood, poultry, pork, and beef. - Pt denies  having any food aversions/any food items that she has negative feelings toward.  - Husband states that she eats a variety of foods (rather than having "safe" foods that she eats every day), but that she greatly enjoys pasta.  -Husband is highly concerned about pt's limited fluid intake and tendency to become dehydrated quickly. - Pt reports that she likes soup, but husband states that on the day prior to admission pt asked for soup and a sandwich of which she ate 1 bite of sandwich and refused the soup.  - Husband states that during that meal pt fell asleep.  - Pt denies feeling overtly tired during meal times/times of eating.  - Weight this AM is +2 kg since admission; will monitor weight trends as pt has been receiving IVF.  - Will order vanilla Ensure to be used as "creamer" in coffee and recommend Boost Breeze be used when providing PO medications.   K: 3.4 mmol/L, Mg: 1.4 mg/dL, Phos: 2.6 mg/dL IVF: D5-NS @ 50 mL/hr (204 kcal).     12/5 - She has been off BiPAP and on Wilkin since shortly before rounds this AM.  - Diet advanced from NPO to Dysphagia 3, thin liquids today at 1308.  - Pt with hx of anorexia nervosa since 1998.  12/4 - Physical assessment shows severe muscle and severe fat wasting; no edema present at this time.  - Pt is currently on BiPAP with MVe: 9.1 L/min and is NPO.  - She is unable to provide information and no family/visitors at bedside.  - It is not known at this time if pt will require TF or if diet will be able to be advanced in 1-2 days - If pt requires TF, recommend Osmolite 1.2 @ 10 mL/hr and advance by 5 mL every 24 hours to reach goal rate of Osmolite 1.2 @ 35 mL/hr.At goal rate, this regimen will provide 1008 kcal, 47 grams of protein, 132 grams of carbohydrate, and 689 mL free water. - Recommended initial rate for TF will provide 288 kcal, 13 grams of protein, 38 grams of carbohydrate, and 197 mL free water.  - Pt with hx of anorexia nervosa and weight  has been fairly stable (26.8-29.5 kg) since 08/31/2012.  - Pt is at high risk of refeeding if TF provided d/t hx of eating disorder and based on information italicized above from H&P.   IVF:D5-NS @ 50 mL/hr (204 kcal). Drip:narcan @2  mg/hr.   Diet Order:  DIET DYS 3 Room service appropriate? Yes; Fluid consistency: Thin  Skin:  Wound (see comment) (Stage 2 bilateral coccyx pressure injury; L ear DTI (per RN))  Last BM:  12/6  Height:   Ht Readings from Last 1 Encounters:  01/19/16 4' 11"  (1.499 m)    Weight:   Wt Readings from Last 1 Encounters:  01/23/16 69 lb 3.6 oz (31.4 kg)    Ideal Body Weight:  42.91 kg  BMI:  Body mass index  is 13.98 kg/m.  Estimated Nutritional Needs:   Kcal:  [id number] (35-40 kcal/kg)  Protein:  41-54 grams (1.5-2 grams/kg)  Fluid:  >/= 1.2 L/day  EDUCATION NEEDS:   No education needs identified at this time    Jarome Matin, MS, RD, LDN, CNSC Inpatient Clinical Dietitian Pager # [phone] After hours/weekend pager # [phone]

## 2016-01-23 NOTE — Care Management Important Message (Signed)
Important Message  Patient Details  Name: Marchetta Soles MRN: [id number] Date of Birth: [birth year]   Medicare Important Message Given:  Yes    Camillo Flaming 01/23/2016, 10:34 AMImportant Message  Patient Details  Name: Raelle Leddon MRN: [id number] Date of Birth: 1945-07-10   Medicare Important Message Given:  Yes    Camillo Flaming 01/23/2016, 10:34 AM

## 2016-01-23 NOTE — NC FL2 (Signed)
[place] MEDICAID FL2 LEVEL OF CARE SCREENING TOOL     IDENTIFICATION  Patient Name: Regina [name] Birthdate: [birth year] Sex: female Admission Date (Current Location): 01/19/2016  [facility] and [place] Number:  Herbalist and Address:  [facility],  [address] [place], [place]      Provider Number: [id number]  Attending Physician Name and Address:  Donne Hazel, MD  Relative Name and Phone Number:       Current Level of Care: Hospital Recommended Level of Care: [place] Prior Approval Number:    Date Approved/Denied:   PASRR Number: [id number]  Discharge Plan: SNF    Current Diagnoses: Patient Active Problem List   Diagnosis Date Noted  . Aspiration pneumonia of left lower lobe ([place]) 01/19/2016  . Pressure injury of skin 01/19/2016  . Anorexia   . Benzodiazepine overdose of undetermined intent   . Opioid overdose   . Acute respiratory failure with hypoxia and hypercapnia (HCC)   . Failure to thrive (0-17)   . Dizziness and giddiness 01/15/2016  . Right hand weakness 01/15/2016  . Pulmonary hypertension 08/24/2015  . CAP (community acquired pneumonia) 07/21/2014  . Community acquired pneumonia 07/21/2014  . Protein-calorie malnutrition, severe ([place]) 09/01/2012  . Pneumonia 08/31/2012  . Chronic pain syndrome 08/31/2012  . Anxiety state, unspecified 08/31/2012  . Cigarette smoker 08/31/2012  . Anorexia nervosa 08/31/2012  . Severe malnutrition ([place]) 08/31/2012  . Venous insufficiency 06/01/2012  . Ascending aortic aneurysm ([place]) 08/23/2011  . COPD GOLD 0  08/23/2011  . Hypertension   . Cervical spondylosis with myelopathy   . Occipital neuralgia   . PERIPHERAL EDEMA 02/03/2010    Orientation RESPIRATION BLADDER Height & Weight     Self, Time, Situation, Place  O2 (2L) Incontinent Weight: 69 lb 3.6 oz (31.4 kg) Height:  4\' 11"  (149.9 cm)  BEHAVIORAL SYMPTOMS/MOOD NEUROLOGICAL BOWEL NUTRITION STATUS   Incontinent Diet (Dys 3 Diet)  AMBULATORY STATUS COMMUNICATION OF NEEDS Skin   Extensive Assist Verbally PressureInjury12/04/17Unstageable-Fullthicknesstissuelossinwhichthebaseoftheulceriscoveredbyslough(yellow,tan,gray,greenorbrown)and/oreschar(tan,brownorblack)inthewoundbed.escoriatedandredtobilateralbuttocksandl (coccyx)  PressureInjury12/07/17StageII-Partialthicknesslossofdermispresentingasashallowopenulcerwithared,pinkwoundbedwithoutslough. (left buttock)  PressureInjury12/07/17DeepTissueInjury-Purpleormaroonlocalizedareaofdiscoloredintactskinorblood-filledblisterduetodamageofunderlyingsofttissuefrompressureand/orshear. (left anterior ankle)  PressureInjury12/07/17DeepTissueInjury-Purpleormaroonlocalizedareaofdiscoloredintactskinorblood-filledblisterduetodamageofunderlyingsofttissuefrompressureand/orshear. (left heel)  PressureInjury12/07/17StageII-Partialthicknesslossofdermispresentingasashallowopenulcerwithared,pinkwoundbedwithoutslough. (medial vertebral column)  PressureInjury12/07/17DeepTissueInjury-Purpleormaroonlocalizedareaofdiscoloredintactskinorblood-filledblisterduetodamageofunderlyingsofttissuefrompressureand/orshear. (right anterior heel)  PressureInjury12/07/17StageI-Intactskinwithnon-blanchablerednessofalocalizedareausuallyoverabonyprominence. (right anterior ankle)               Personal Care Assistance Level of Assistance  Bathing, Dressing Bathing Assistance: Limited assistance   Dressing Assistance: Limited assistance     Functional Limitations [place]  PT (By licensed PT), OT (By licensed OT)     PT Frequency: 5 OT Frequency: 5            Contractures      Additional Factors Info  Code Status, Allergies Code  Status Info: DNR Allergies Info:  Azithromycin, Erythromycin           Current Medications (01/23/2016):  This is the current hospital active medication list Current Facility-Administered Medications  Medication Dose Route Frequency Provider Last Rate Last Dose  . 0.9 %  sodium chloride infusion  250 mL Intravenous PRN Donita Brooks, NP      . acetaminophen (TYLENOL) tablet 650 mg  650 mg Oral Q6H PRN Donita Brooks, NP   650 mg at 01/23/16 1156  . albuterol (PROVENTIL) (2.5 MG/3ML) 0.083% nebulizer solution 2.5 mg  2.5 mg Nebulization Q6H PRN Donne Hazel, MD      . amoxicillin-clavulanate (AUGMENTIN) 500-125 MG per tablet 500 mg  1 tablet Oral BID Donne Hazel, MD   500 mg at 01/23/16 [id number]  . dextrose 5 %-0.9 % sodium chloride infusion   Intravenous Continuous Donita Brooks, NP 50 mL/hr at 01/23/16 0307    . feeding supplement (BOOST / RESOURCE BREEZE) liquid 1 Container  1 Container Oral Q24H Donne Hazel, MD   1 Container at 01/21/16 1339  . feeding supplement (ENSURE ENLIVE) (ENSURE ENLIVE) liquid 237 mL  237 mL Oral Q24H Donne Hazel, MD   237 mL at 01/23/16 0942  . heparin injection 2,500 Units  2,500 Units Subcutaneous Q12H Donita Brooks, NP   2,500 Units at 01/23/16 0933  . ipratropium (ATROVENT) nebulizer solution 0.5 mg  0.5 mg Nebulization TID Donne Hazel, MD   0.5 mg at 01/23/16 0757  . ipratropium-albuterol (DUONEB) 0.5-2.5 (3) MG/3ML nebulizer solution 3 mL  3 mL Nebulization Q6H PRN Donne Hazel, MD      . isosorbide mononitrate (IMDUR) 24 hr tablet 30 mg  30 mg Oral Daily Donne Hazel, MD   30 mg at 01/23/16 0934  . levalbuterol (XOPENEX) nebulizer solution 0.63 mg  0.63 mg Nebulization TID Donne Hazel, MD   0.63 mg at 01/23/16 0757  . levalbuterol (XOPENEX) nebulizer solution 0.63 mg  0.63 mg Nebulization Q4H PRN Donne Hazel, MD      . methylPREDNISolone sodium succinate (SOLU-MEDROL) 40 mg/mL injection 40 mg  40 mg Intravenous Q8H Donne Hazel, MD    40 mg at 01/23/16 0606  . naloxone [facility]) 4 mg in dextrose 5 % 250 mL infusion  2 mg/hr Intravenous Continuous April Palumbo, MD   Stopped at 01/19/16 1200  . ondansetron (ZOFRAN) injection 4 mg  4 mg Intravenous Q6H PRN Donita Brooks, NP         Discharge Medications: Please see discharge summary for a list of discharge medications.  Relevant Imaging Results:  Relevant Lab Results:   Additional Information SSN: [id number]  Standley Brooking, LCSW

## 2016-01-23 NOTE — Progress Notes (Signed)
SATURATION QUALIFICATIONS: (This note is used to comply with regulatory documentation for home oxygen)  Patient Saturations on Room Air at Rest = 83%  Patient Saturations on Room Air while Ambulating = n/a  Patient Saturations on 2 Liters of oxygen while at rest (patient is wheelchair bound and does not ambulate) = 94%  Please briefly explain why patient needs home oxygen: patient will need oxygen when discharged to [facility]

## 2016-01-23 NOTE — Progress Notes (Signed)
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 As per her family patient eating very poorly with increased weakness and lethargic. She was seen by neurologist on 11/30 and an MRI of the brain was ordered but has not been done yet. Patient stopped eating or drinking for several days. Family doubt patient was taking excess of her pain medications and benzodiazepine. She was having nonproductive cough for last few days. At baseline she embolus with a walker. In the ED she was found to be in acute hypoxic respiratory failure, early sepsis with WBC of 21.9K, hemoglobin of 17 and lactic acid of 3.32. Blood glucose is 176. Troponin of 0.06. Chest x-ray showed left basilar infiltrate concerning for pneumonia. ABG showed pH of 7.207, PCO2 of 70 and PO2 of 281 In the ED she was she was placed on BiPAP and started on Narcan drip. Seen by [facility] CM and admitted to ICU. Transfer to hospitalist service next day.  Assessment & Plan:   Active Problems:   Aspiration pneumonia of left lower lobe (HCC)   Pressure injury of skin   Anorexia   Benzodiazepine overdose of undetermined intent   Opioid overdose   Acute respiratory failure with hypoxia and hypercapnia (HCC)   Failure to thrive (0-17)   Rinse about problem Acute hypercapnic respiratory failure Likely secondary to aspiration pneumonia, COPD and effect of narcotics/benzodiazepine. - had required narcan gtt, now off - Patient initially bipap dependent, now weaned to [place] [facility] to [facility] - Improving   Active Problems:   Aspiration pneumonia of left lower lobe ([place])  with sepsis present on admission - Patient was started on empiric IV vancomycin and Zosyn initially.  - Blood culture, urine for strep antigen and Legionella negative thus far - MRSA swab is neg, thus low likelihood for MRSA pna, thus d/c'd vancomycin - Patient has been continued on augmentin - Afebrile overnight - Mild leukocytosis, however is likely secondary to steroids  SIRS ([place]) Secondary to lobar pneumonia. -Sepsis physiology resolved Lactic acid normalized. Leukocytosis normalized, now mildly elevated secondary to steroids -cont augmentin    Acute encephalopathy secondary to Benzodiazepine overdose of undetermined intent/ Opioid overdose - Patient had been on chronic pain medications for cervical spondylosis and arthritis with cervical fusion. - Family deny that patient was overdosing on her narcotic or benzodiazepine. However patient is also severely malnourished and underweight. - Holding sedating medications for now. Patient would likely benefit from lower doses of pain medications at time of discharge - currently stable  COPD - Continue when necessary nebs.  - without wheezing -cxr with findings of hyperinflation consistent with COPD - O2 now 2LNC. - Will decrease solumedrol to q12h dosing - For now, continue breathing treatments as scheduled  Severe protein calorie malnutrition and failure to thrive/anorexia - Secondary to poor PO intake - Appreciate input by Nutrition. - Continue boost per recommendations  Hypokalemia/hypomagnesemia - replaced - Labs reviewed. Electrolytes remain stable  Initially thought UTI, ruled out - Reviewed UA that was obtained at admission - Mod blood, but no leukocytes, no nitrites, only 0-5 WBC's - Many bacteria on UA, likely asymptomatic bacturia, not UTI - Appears stable  at present  Essential hypertension  - Thus far remains off home meds - BP recently elevated, restarted home imdur - BP improved  Chronic kidney  disease - Renal function remains stable this AM and unremarkable  Sacral Decubitus ulcer - WOC consulted at admission - Stable at present  Documented bloody stool - Hemoglobin within normal range - No other issues of GI bleeding reported   Leukocytosis - Likely secondary to solumedrol - Repeat CBC in AM  Transient sinus tach - asymptomatic at rest - new finding - at present, HR is rate controlled and stable - Cont to monitor on tele for now - Will check lytes in AM and mg  DVT prophylaxis: Heparin subQ Code Status: DNR Family Communication: Pt in room, husband at bedside Disposition Plan: transfer to med-tele  Consultants:     Procedures:     Antimicrobials: Anti-infectives    Start     Dose/Rate Route Frequency Ordered Stop   01/22/16 1200  amoxicillin-clavulanate (AUGMENTIN) 500-125 MG per tablet 500 mg     1 tablet Oral 2 times daily 01/22/16 0828     01/21/16 0600  vancomycin (VANCOCIN) IVPB 750 mg/150 ml premix     750 mg 150 mL/hr over 60 Minutes Intravenous  Once 01/21/16 0418 01/21/16 0636   01/20/16 0600  vancomycin (VANCOCIN) 500 mg in sodium chloride 0.9 % 100 mL IVPB     500 mg 100 mL/hr over 60 Minutes Intravenous  Once 01/20/16 0502 01/20/16 0703   01/19/16 1400  piperacillin-tazobactam (ZOSYN) IVPB 2.25 g  Status:  Discontinued     2.25 g 100 mL/hr over 30 Minutes Intravenous Every 8 hours 01/19/16 1043 01/22/16 0828   01/19/16 0545  vancomycin (VANCOCIN) IVPB 1000 mg/200 mL premix     1,000 mg 200 mL/hr over 60 Minutes Intravenous  Once 01/19/16 0539 01/19/16 0904   01/19/16 0545  piperacillin-tazobactam (ZOSYN) IVPB 3.375 g     3.375 g 100 mL/hr over 30 Minutes Intravenous  Once 01/19/16 0539 01/19/16 0655      Subjective: No complaints today  Objective: Vitals:   01/23/16 1157 01/23/16 1204 01/23/16 1330 01/23/16 1332  BP:   105/67   Pulse:   79   Resp:   (!) 22   Temp:   98.1 F (36.7 C)   TempSrc:   Oral   SpO2: (!) 89% 94%  (!) 83% 94%  Weight:      Height:        Intake/Output Summary (Last 24 hours) at 01/23/16 1731 Last data filed at 01/23/16 1500  Gross per 24 hour  Intake          1155.83 ml  Output                0 ml  Net          1155.83 ml   Filed Weights   01/21/16 0500 01/22/16 0500 01/23/16 [id number]  Weight: 29 kg (63 lb 14.9 oz) 30 kg (66 lb 2.2 oz) 31.4 kg (69 lb 3.6 oz)    Examination:  General exam: Awake, in nad, conversant Respiratory system: normal resp effort, no wheezing Cardiovascular system: regular rhythm, ascultated s1 s2 Gastrointestinal system: no masses, normal bs Central nervous system: no seizures, no tremors Extremities: no cyanosis, no joint deformities Skin: no rashes, no notable skin lesions seen Psychiatry: affect normal// no auditory hallucinations  Data Reviewed: I have personally reviewed following labs and imaging studies  CBC:  Recent Labs Lab 01/19/16 0500  01/19/16 1527 01/20/16 0331 01/21/16 0317 01/22/16 0321 01/23/16 0500  WBC 21.9*  --  28.2* 29.0* 14.9* 10.5 14.6*  NEUTROABS 19.9*  --  26.3*  --   --   --   --   HGB 17.2*  < > 15.3* 14.8 14.2 13.7 13.2  HCT 51.6*  < > 45.9 45.5 42.1 41.5 40.6  MCV 90.5  --  90.4 91.4 89.6 88.1 89.6  PLT 255  --  194 184 149* 179 203  < > = values in this interval not displayed. Basic Metabolic Panel:  Recent Labs Lab 01/19/16 0500 01/19/16 0510 01/20/16 0331 01/21/16 0317 01/22/16 0321 01/23/16 0500  NA 131* 134* 131* 135 132* 136  K 3.8 3.8 3.4* 3.4* 3.6 3.5  CL 93* 94* 96* 101 98* 102  CO2 27  --  26 25 29 30   GLUCOSE 187* 176* 113* 97 139* 128*  BUN 26* 26* 16 14 12 13   CREATININE 0.70 0.80 0.57 0.30* 0.31* 0.30*  CALCIUM 10.8*  --  9.2 9.4 9.1 9.4  MG  --   --  1.4*  --  1.8  --   PHOS  --   --  2.6  --   --   --    GFR: Estimated Creatinine Clearance: 32.4 mL/min (by C-G formula based on SCr of 0.3 mg/dL (L)). Liver Function Tests:  Recent Labs Lab 01/19/16 0500 01/23/16 0500  AST  32 21  ALT 27 20  ALKPHOS 88 63  BILITOT 1.0 0.5  PROT 8.3* 5.6*  ALBUMIN 4.3 2.5*   No results for input(s): LIPASE, AMYLASE in the last 168 hours. No results for input(s): AMMONIA in the last 168 hours. Coagulation Profile: No results for input(s): INR, PROTIME in the last 168 hours. Cardiac Enzymes: No results for input(s): CKTOTAL, CKMB, CKMBINDEX, TROPONINI in the last 168 hours. BNP (last 3 results) No results for input(s): PROBNP in the last 8760 hours. HbA1C: No results for input(s): HGBA1C in the last 72 hours. CBG:  Recent Labs Lab 01/19/16 0534  GLUCAP 155*   Lipid Profile: No results for input(s): CHOL, HDL, LDLCALC, TRIG, CHOLHDL, LDLDIRECT in the last 72 hours. Thyroid Function Tests: No results for input(s): TSH, T4TOTAL, FREET4, T3FREE, THYROIDAB in the last 72 hours. Anemia Panel: No results for input(s): VITAMINB12, FOLATE, FERRITIN, TIBC, IRON, RETICCTPCT in the last 72 hours. Sepsis Labs:  Recent Labs Lab 01/19/16 0622 01/19/16 1012 01/19/16 1401 01/19/16 1527 01/20/16 0331 01/21/16 0317  PROCALCITON  --  0.20  --  0.36 0.38 0.20  LATICACIDVEN 3.32* 2.1* 1.9  --   --   --     Recent Results (from the past 240 hour(s))  MRSA PCR Screening     Status: None   Collection Time: 01/19/16  4:43 AM  Result Value Ref Range Status   MRSA by PCR NEGATIVE NEGATIVE Final    Comment:        The GeneXpert MRSA Assay (FDA approved for NASAL specimens only), is one component of a comprehensive MRSA colonization surveillance program. It is not intended to diagnose MRSA infection nor to guide or monitor treatment for MRSA infections.   Culture, blood (routine x 2)     Status: None (Preliminary result)   Collection Time: 01/19/16  6:00 AM  Result Value Ref Range Status   Specimen Description BLOOD LEFT HAND  Final   Special Requests IN PEDIATRIC BOTTLE [place]  Final   Culture   Final    NO  GROWTH 4 DAYS Performed at [facility]    Report  Status PENDING  Incomplete     Radiology Studies: No results found.  Scheduled Meds: . amoxicillin-clavulanate  1 tablet Oral BID  . feeding supplement  1 Container Oral Q24H  . feeding supplement (ENSURE ENLIVE)  237 mL Oral Q24H  . heparin subcutaneous  2,500 Units Subcutaneous Q12H  . ipratropium  0.5 mg Nebulization TID  . isosorbide mononitrate  30 mg Oral Daily  . levalbuterol  0.63 mg Nebulization TID  . [START ON 01/24/2016] methylPREDNISolone (SOLU-MEDROL) injection  40 mg Intravenous Q12H   Continuous Infusions: . dextrose 5 % and 0.9% NaCl 50 mL/hr at 01/23/16 0307  . naLOXone [facility]) adult infusion for OVERDOSE Stopped (01/19/16 1200)     LOS: 4 days   Freida Nebel, Orpah Melter, MD [facility] Pager [phone]  If 7PM-7AM, please contact night-coverage [email or web address] Password [facility] 01/23/2016, 5:31 PM

## 2016-01-23 NOTE — Evaluation (Signed)
Occupational Therapy Evaluation Patient Details Name: Regina [name] MRN: [id number] DOB: [birth year] Today's Date: 01/23/2016    History of Present Illness 70 y/o F with PMH of arthritis, cervical spondylosis with myelopathy s/p spinal fusion, CKD, AAA, cholecystectomy, HTN, anorexia  and COPD who presented to [facility] on 12/4 via EMS with reports of lethargy, poor intake and generalized weakness   Clinical Impression   Pt admitted with lethargy . Pt currently with functional limitations due to the deficits listed below (see OT Problem List).  Pt will benefit from skilled OT to increase their safety and independence with ADL and functional mobility for ADL to facilitate discharge to venue listed below.      Follow Up Recommendations  SNF    Equipment Recommendations  None recommended by OT       Precautions / Restrictions Precautions Precautions: Fall Precaution Comments: multiple skin breakdown sites, do not "pull on the neck"- fused.      Mobility Bed Mobility Overal bed mobility: Needs Assistance Bed Mobility: Rolling Rolling: Total assist            Transfers                 General transfer comment: NT         ADL Overall ADL's : Needs assistance/impaired Eating/Feeding: Moderate assistance;Bed level   Grooming: Moderate assistance;Bed level                                 General ADL Comments: encouraged pt and husband to let pt feed self as well as grooming to give BUE arms opportunity to A               Pertinent Vitals/Pain Faces Pain Scale: Hurts little more Pain Location: back Pain Descriptors / Indicators: Aching;Sore Pain Intervention(s): Monitored during session     Hand Dominance Right   Extremity/Trunk Assessment Upper Extremity Assessment Upper Extremity Assessment: Generalized weakness (Pts LUE wrist tends to be in flexed postion but pt able to move and extend wrist on command)           Communication  Communication Communication: No difficulties   Cognition Arousal/Alertness: Awake/alert Behavior During Therapy: Flat affect Overall Cognitive Status: Difficult to assess                 General Comments: patient appears aware of environment.    General Comments       Exercises       Shoulder Instructions      Home Living Family/patient expects to be discharged to:: Private residence Living Arrangements: Spouse/significant other Available Help at Discharge: Family Type of Home: House Home Access: Stairs to enter Technical brewer of Steps: 4 Entrance Stairs-Rails: Right Home Layout: One level     Bathroom Shower/Tub: Teacher, early years/pre: [place]: Environmental consultant - 2 wheels          Prior Functioning/Environment Level of [place]: Needs assistance  Gait / Transfers Assistance Needed: ambulated in home with/w/o RW.  ADL's / Homemaking Assistance Needed: assist with bath            OT Problem List: Decreased strength;Decreased activity tolerance;Impaired balance (sitting and/or standing)   OT Treatment/Interventions: Self-care/ADL training;DME and/or AE instruction;Therapeutic activities    OT Goals(Current goals can be found in the care plan section) Acute Rehab OT Goals Patient Stated Goal: per spouse , to go  home OT Goal Formulation: With patient Time For Goal Achievement: 02/06/16  OT Frequency: Min 2X/week   Barriers to D/C:               End of Session Nurse Communication: Mobility status  Activity Tolerance:   Patient left: in bed;with call bell/phone within reach;with family/visitor present   Time: [id number] OT Time Calculation (min): 18 min Charges:  OT General Charges $OT Visit: 1 Procedure OT Evaluation $OT Eval Moderate Complexity: 1 Procedure G-Codes:    Payton Mccallum D 25-Jan-2016, 11:14 AM

## 2016-01-23 NOTE — Clinical Social Work Placement (Signed)
Patient has a bed at [facility]. CSW has completed FL2 & will continue to follow and assist with discharge when ready.    Raynaldo Opitz, [facility] Clinical Social Worker cell #: [phone]    CLINICAL SOCIAL WORK PLACEMENT  NOTE  Date:  01/23/2016  Patient Details  Name: Destenie Poertner MRN: [id number] Date of Birth: 1945/09/28  Clinical Social Work is seeking post-discharge placement for this patient at the [place] level of care (*CSW will initial, date and re-position this form in  chart as items are completed):  Yes   Patient/family provided with [place] Work Department's list of facilities offering this level of care within the geographic area requested by the patient (or if unable, by the patient's family).  Yes   Patient/family informed of their freedom to choose among providers that offer the needed level of care, that participate in Medicare, Medicaid or managed care program needed by the patient, have an available bed and are willing to accept the patient.  Yes   Patient/family informed of [place]'s ownership interest in [facility] and [facility], as well as of the fact that they are under no obligation to receive care at these facilities.  PASRR submitted to EDS on 01/23/16     PASRR number received on 01/23/16     Existing PASRR number confirmed on       FL2 transmitted to all facilities in geographic area requested by pt/family on 01/23/16     FL2 transmitted to all facilities within larger geographic area on       Patient informed that his/her managed care company has contracts with or will negotiate with certain facilities, including the following:        Yes   Patient/family informed of bed offers received.  Patient chooses bed at [facility]     Physician recommends and patient chooses bed at      Patient to be transferred to [facility] on   .  Patient to be transferred to facility by       Patient family notified on   of transfer.  Name of family member notified:        PHYSICIAN       Additional Comment:    _______________________________________________ Standley Brooking, LCSW 01/23/2016, 12:55 PM

## 2016-01-23 NOTE — Progress Notes (Signed)
Pt unable to follow directions and take PO antibiotic.

## 2016-01-23 NOTE — Care Management Note (Signed)
Case Management Note  Patient Details  Name: Regina [name] MRN: [id number] Date of Birth: [birth year]  Subjective/Objective:  Transfer from SDU. PT/OT-recc SNF. CSW already following.                  Action/Plan:d/c SNF.   Expected Discharge Date:   (unknown)               Expected Discharge Plan:  Skilled Nursing Facility  In-House Referral:  Clinical Social Work, Nutrition  Discharge planning Services  CM Consult  Post Acute Care Choice:    Choice offered to:     DME Arranged:    DME Agency:     HH Arranged:    [place] Agency:     Status of Service:  In process, will continue to follow  If discussed at Long Length of Stay Meetings, dates discussed:    Additional Comments:  Dessa Phi, RN 01/23/2016, 1:07 PM

## 2016-01-24 LAB — CBC
HEMATOCRIT: 39.5 % (ref 36.0–46.0)
Hemoglobin: 12.5 g/dL (ref 12.0–15.0)
MCH: 29.5 pg (ref 26.0–34.0)
MCHC: 31.6 g/dL (ref 30.0–36.0)
MCV: 93.2 fL (ref 78.0–100.0)
PLATELETS: 185 10*3/uL (ref 150–400)
RBC: 4.24 MIL/uL (ref 3.87–5.11)
RDW: 14.5 % (ref 11.5–15.5)
WBC: 7.1 10*3/uL (ref 4.0–10.5)

## 2016-01-24 LAB — COMPREHENSIVE METABOLIC PANEL
ALT: 34 U/L (ref 14–54)
AST: 40 U/L (ref 15–41)
Albumin: 2.4 g/dL — ABNORMAL LOW (ref 3.5–5.0)
Alkaline Phosphatase: 60 U/L (ref 38–126)
Anion gap: 3 — ABNORMAL LOW (ref 5–15)
BILIRUBIN TOTAL: 0.3 mg/dL (ref 0.3–1.2)
BUN: 15 mg/dL (ref 6–20)
CALCIUM: 9.7 mg/dL (ref 8.9–10.3)
CO2: 31 mmol/L (ref 22–32)
Chloride: 105 mmol/L (ref 101–111)
Creatinine, Ser: 0.34 mg/dL — ABNORMAL LOW (ref 0.44–1.00)
GFR calc Af Amer: >60 mL/min (ref >60)
Glucose, Bld: 145 mg/dL — ABNORMAL HIGH (ref 65–99)
POTASSIUM: 4.1 mmol/L (ref 3.5–5.1)
Sodium: 139 mmol/L (ref 135–145)
TOTAL PROTEIN: 5.3 g/dL — AB (ref 6.5–8.1)

## 2016-01-24 LAB — CULTURE, BLOOD (ROUTINE X 2): CULTURE: NO GROWTH

## 2016-01-24 MED ORDER — IPRATROPIUM BROMIDE 0.02 % IN SOLN
0.5000 mg | Freq: Two times a day (BID) | RESPIRATORY_TRACT | Status: DC
  Administered 2016-01-24 – 2016-01-26 (×5): 0.5 mg via RESPIRATORY_TRACT
  Filled 2016-01-24 (×5): qty 2.5

## 2016-01-24 MED ORDER — SODIUM CHLORIDE 0.9 % IV BOLUS (SEPSIS)
250.0000 mL | Freq: Once | INTRAVENOUS | Status: AC
  Administered 2016-01-24: 250 mL via INTRAVENOUS

## 2016-01-24 MED ORDER — LEVALBUTEROL HCL 0.63 MG/3ML IN NEBU
0.6300 mg | INHALATION_SOLUTION | Freq: Two times a day (BID) | RESPIRATORY_TRACT | Status: DC
  Administered 2016-01-24 – 2016-01-26 (×5): 0.63 mg via RESPIRATORY_TRACT
  Filled 2016-01-24 (×5): qty 3

## 2016-01-24 MED ORDER — ISOSORBIDE MONONITRATE ER 30 MG PO TB24: 15.0000 mg | ORAL_TABLET | Freq: Every day | ORAL | Status: DC

## 2016-01-24 MED ORDER — METOPROLOL TARTRATE 25 MG PO TABS
12.5000 mg | ORAL_TABLET | Freq: Two times a day (BID) | ORAL | Status: DC
  Administered 2016-01-24 – 2016-01-26 (×4): 12.5 mg via ORAL
  Filled 2016-01-24 (×4): qty 1

## 2016-01-24 NOTE — Progress Notes (Signed)
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 As per her family patient eating very poorly with increased weakness and lethargic. She was seen by neurologist on 11/30 and an MRI of the brain was ordered but has not been done yet. Patient stopped eating or drinking for several days. Family doubt patient was taking excess of her pain medications and benzodiazepine. She was having nonproductive cough for last few days. At baseline she embolus with a walker. In the ED she was found to be in acute hypoxic respiratory failure, early sepsis with WBC of 21.9K, hemoglobin of 17 and lactic acid of 3.32. Blood glucose is 176. Troponin of 0.06. Chest x-ray showed left basilar infiltrate concerning for pneumonia. ABG showed pH of 7.207, PCO2 of 70 and PO2 of 281 In the ED she was she was placed on BiPAP and started on Narcan drip. Seen by [facility] CM and admitted to ICU. Transfer to hospitalist service next day.  Assessment & Plan:   Active Problems:   Aspiration pneumonia of left lower lobe (HCC)   Pressure injury of skin   Anorexia   Benzodiazepine overdose of undetermined intent   Opioid overdose   Acute respiratory failure with hypoxia and hypercapnia (HCC)   Failure to thrive (0-17)   Rinse about problem Acute hypercapnic respiratory failure - Likely secondary to aspiration pneumonia, COPD and effect of narcotics/benzodiazepine. - had required narcan gtt, now off - patient continued on [facility]   Active Problems:   Aspiration pneumonia of left lower lobe ([place]) with sepsis present on admission - Patient was  started on empiric IV vancomycin and Zosyn initially.  - Blood culture, urine for strep antigen and Legionella negative thus far - MRSA swab is neg, thus low likelihood for MRSA pna, thus d/c'd vancomycin - Patient has been continued on augmentin -Patient remains afebrile  SIRS ([place]) Secondary to lobar pneumonia. -Sepsis physiology resolved Lactic acid normalized. Leukocytosis normalized, now mildly elevated secondary to steroids -will continue augmentin    Acute encephalopathy secondary to Benzodiazepine overdose of undetermined intent/ Opioid overdose - Patient had been on chronic pain medications for cervical spondylosis and arthritis with cervical fusion. - Family deny that patient was overdosing on her narcotic or benzodiazepine. However patient is also severely malnourished and underweight. - Holding sedating medications for now. Patient would likely benefit from lower doses of pain medications at time of discharge - appears near baseline  COPD - Continue when necessary nebs.  - without wheezing -cxr with findings of hyperinflation consistent with COPD - O2 now 2LNC. - Will decrease solumedrol to q12h dosing - continue to wean o2  Severe protein calorie malnutrition and failure to thrive/anorexia - Secondary to poor PO intake - Appreciate input by Nutrition. -tolerating boost  Hypokalemia/hypomagnesemia - replaced - Labs reviewed. Electrolytes remain stable  Initially thought UTI, ruled out - Reviewed UA that was obtained at admission - Mod blood, but no leukocytes, no nitrites, only 0-5 WBC's - Many bacteria on UA, likely asymptomatic bacturia, not UTI - remains stable  Essential hypertension  - Thus far remains off home meds - BP recently elevated, restarted home imdur - stable at  present  Chronic kidney disease - stable -Repeat bmet in AM  Sacral Decubitus ulcer - WOC consulted at admission - remains stable  Documented bloody stool - Hemoglobin  within normal range - No other issues of GI bleeding reported  -repeat cbc in aM -stable  Leukocytosis - Likely secondary to solumedrol - stable -check cbc in am  Transient sinus tach - asymptomatic at rest - patient is orthostatic - dry mucus membranes on exam - will give IVF bolus and increase IVF  Orthostatic hypotension -Per above -new finding  DVT prophylaxis: Heparin subQ Code Status: DNR Family Communication: Pt in room, husband at bedside Disposition Plan: transfer to med-tele  Consultants:     Procedures:     Antimicrobials: Anti-infectives    Start     Dose/Rate Route Frequency Ordered Stop   01/22/16 1200  amoxicillin-clavulanate (AUGMENTIN) 500-125 MG per tablet 500 mg     1 tablet Oral 2 times daily 01/22/16 0828     01/21/16 0600  vancomycin (VANCOCIN) IVPB 750 mg/150 ml premix     750 mg 150 mL/hr over 60 Minutes Intravenous  Once 01/21/16 0418 01/21/16 0636   01/20/16 0600  vancomycin (VANCOCIN) 500 mg in sodium chloride 0.9 % 100 mL IVPB     500 mg 100 mL/hr over 60 Minutes Intravenous  Once 01/20/16 0502 01/20/16 0703   01/19/16 1400  piperacillin-tazobactam (ZOSYN) IVPB 2.25 g  Status:  Discontinued     2.25 g 100 mL/hr over 30 Minutes Intravenous Every 8 hours 01/19/16 1043 01/22/16 0828   01/19/16 0545  vancomycin (VANCOCIN) IVPB 1000 mg/200 mL premix     1,000 mg 200 mL/hr over 60 Minutes Intravenous  Once 01/19/16 0539 01/19/16 0904   01/19/16 0545  piperacillin-tazobactam (ZOSYN) IVPB 3.375 g     3.375 g 100 mL/hr over 30 Minutes Intravenous  Once 01/19/16 0539 01/19/16 0655      Subjective: Without complaints  Objective: Vitals:   01/23/16 2133 01/24/16 0426 01/24/16 0856 01/24/16 1341  BP: 103/79 100/65    Pulse: 69 67  80  Resp: (!) 21 20  18   Temp: 98.2 F (36.8 C) 98 F (36.7 C)  98.8 F (37.1 C)  TempSrc: Oral Oral  Oral  SpO2: 98% 98% 98% 98%  Weight:  31.2 kg (68 lb 12.5 oz)    Height:        Intake/Output  Summary (Last 24 hours) at 01/24/16 1718 Last data filed at 01/24/16 1500  Gross per 24 hour  Intake          2061.67 ml  Output                0 ml  Net          2061.67 ml   Filed Weights   01/22/16 0500 01/23/16 0637 01/24/16 0426  Weight: 30 kg (66 lb 2.2 oz) 31.4 kg (69 lb 3.6 oz) 31.2 kg (68 lb 12.5 oz)    Examination:  General exam: laying in bed, in nad Respiratory system: normal chest rise, no audible wheezing Cardiovascular system: regular rate, s1, s2 Gastrointestinal system: soft, nondistended Central nervous system: cn2-12 grossly intact, strength intact Extremities: no clubbing, no cyanosis Skin: normal skin turgor, no pallor Psychiatry: mood normal// no visual hallucinations  Data Reviewed: I have personally reviewed following labs and imaging studies  CBC:  Recent Labs Lab 01/19/16 0500  01/19/16 1527 01/20/16 0331 01/21/16 0317 01/22/16 0321 01/23/16 0500 01/24/16 0534  WBC 21.9*  --  28.2* 29.0* 14.9* 10.5 14.6* 7.1  NEUTROABS 19.9*  --  26.3*  --   --   --   --   --   HGB 17.2*  < > 15.3* 14.8 14.2 13.7 13.2 12.5  HCT 51.6*  < > 45.9 45.5 42.1 41.5 40.6 39.5  MCV 90.5  --  90.4 91.4 89.6 88.1 89.6 93.2  PLT 255  --  194 184 149* 179 203 185  < > = values in this interval not displayed. Basic Metabolic Panel:  Recent Labs Lab 01/20/16 0331 01/21/16 0317 01/22/16 0321 01/23/16 0500 01/24/16 0534  NA 131* 135 132* 136 139  K 3.4* 3.4* 3.6 3.5 4.1  CL 96* 101 98* 102 105  CO2 26 25 29 30 31   GLUCOSE 113* 97 139* 128* 145*  BUN 16 14 12 13 15   CREATININE 0.57 0.30* 0.31* 0.30* 0.34*  CALCIUM 9.2 9.4 9.1 9.4 9.7  MG 1.4*  --  1.8  --   --   PHOS 2.6  --   --   --   --    GFR: Estimated Creatinine Clearance: 32.2 mL/min (by C-G formula based on SCr of 0.34 mg/dL (L)). Liver Function Tests:  Recent Labs Lab 01/19/16 0500 01/23/16 0500 01/24/16 0534  AST 32 21 40  ALT 27 20 34  ALKPHOS 88 63 60  BILITOT 1.0 0.5 0.3  PROT 8.3* 5.6*  5.3*  ALBUMIN 4.3 2.5* 2.4*   No results for input(s): LIPASE, AMYLASE in the last 168 hours. No results for input(s): AMMONIA in the last 168 hours. Coagulation Profile: No results for input(s): INR, PROTIME in the last 168 hours. Cardiac Enzymes: No results for input(s): CKTOTAL, CKMB, CKMBINDEX, TROPONINI in the last 168 hours. BNP (last 3 results) No results for input(s): PROBNP in the last 8760 hours. HbA1C: No results for input(s): HGBA1C in the last 72 hours. CBG:  Recent Labs Lab 01/19/16 0534  GLUCAP 155*   Lipid Profile: No results for input(s): CHOL, HDL, LDLCALC, TRIG, CHOLHDL, LDLDIRECT in the last 72 hours. Thyroid Function Tests: No results for input(s): TSH, T4TOTAL, FREET4, T3FREE, THYROIDAB in the last 72 hours. Anemia Panel: No results for input(s): VITAMINB12, FOLATE, FERRITIN, TIBC, IRON, RETICCTPCT in the last 72 hours. Sepsis Labs:  Recent Labs Lab 01/19/16 0622 01/19/16 1012 01/19/16 1401 01/19/16 1527 01/20/16 0331 01/21/16 0317  PROCALCITON  --  0.20  --  0.36 0.38 0.20  LATICACIDVEN 3.32* 2.1* 1.9  --   --   --     Recent Results (from the past 240 hour(s))  MRSA PCR Screening     Status: None   Collection Time: 01/19/16  4:43 AM  Result Value Ref Range Status   MRSA by PCR NEGATIVE NEGATIVE Final    Comment:        The GeneXpert MRSA Assay (FDA approved for NASAL specimens only), is one component of a comprehensive MRSA colonization surveillance program. It is not intended to diagnose MRSA infection nor to guide or monitor treatment for MRSA infections.   Culture, blood (routine x 2)     Status: None   Collection Time: 01/19/16  6:00 AM  Result Value Ref Range Status   Specimen Description BLOOD LEFT HAND  Final   Special Requests IN PEDIATRIC BOTTLE [place]  Final   Culture   Final    NO GROWTH 5 DAYS Performed at [facility]    Report Status 01/24/2016 FINAL  Final     Radiology  Studies: No results  found.  Scheduled Meds: . amoxicillin-clavulanate  1 tablet Oral BID  . feeding supplement  1 Container Oral Q24H  . feeding supplement (ENSURE ENLIVE)  237 mL Oral Q24H  . heparin subcutaneous  2,500 Units Subcutaneous Q12H  . ipratropium  0.5 mg Nebulization BID  . isosorbide mononitrate  30 mg Oral Daily  . levalbuterol  0.63 mg Nebulization BID  . methylPREDNISolone (SOLU-MEDROL) injection  40 mg Intravenous Q12H   Continuous Infusions: . dextrose 5 % and 0.9% NaCl 100 mL/hr at 01/24/16 1056  . naLOXone [facility]) adult infusion for OVERDOSE Stopped (01/19/16 1200)     LOS: 5 days   Franchot Pollitt, Orpah Melter, MD [facility] Pager [phone]  If 7PM-7AM, please contact night-coverage [email or web address] Password [email or web address] 01/24/2016, 5:18 PM

## 2016-01-25 ENCOUNTER — Inpatient Hospital Stay (HOSPITAL_COMMUNITY): Payer: Medicare Other

## 2016-01-25 LAB — CBC
HCT: 41.9 % (ref 36.0–46.0)
HEMOGLOBIN: 13.1 g/dL (ref 12.0–15.0)
MCH: 29.3 pg (ref 26.0–34.0)
MCHC: 31.3 g/dL (ref 30.0–36.0)
MCV: 93.7 fL (ref 78.0–100.0)
Platelets: 166 10*3/uL (ref 150–400)
RBC: 4.47 MIL/uL (ref 3.87–5.11)
RDW: 14.3 % (ref 11.5–15.5)
WBC: 6.1 10*3/uL (ref 4.0–10.5)

## 2016-01-25 LAB — BASIC METABOLIC PANEL
ANION GAP: 6 (ref 5–15)
BUN: 18 mg/dL (ref 6–20)
CO2: 29 mmol/L (ref 22–32)
Calcium: 10 mg/dL (ref 8.9–10.3)
Chloride: 104 mmol/L (ref 101–111)
Creatinine, Ser: 0.34 mg/dL — ABNORMAL LOW (ref 0.44–1.00)
GFR calc Af Amer: >60 mL/min (ref >60)
Glucose, Bld: 157 mg/dL — ABNORMAL HIGH (ref 65–99)
POTASSIUM: 3.8 mmol/L (ref 3.5–5.1)
SODIUM: 139 mmol/L (ref 135–145)

## 2016-01-25 MED ORDER — CLORAZEPATE DIPOTASSIUM 3.75 MG PO TABS: 3.7500 mg | ORAL_TABLET | Freq: Two times a day (BID) | ORAL | 0 refills | Status: AC | PRN

## 2016-01-25 MED ORDER — ACETAMINOPHEN-CODEINE #3 300-30 MG PO TABS: 1.0000 | ORAL_TABLET | Freq: Three times a day (TID) | ORAL | 0 refills | Status: AC | PRN

## 2016-01-25 MED ORDER — METOPROLOL TARTRATE 25 MG PO TABS: 25.0000 mg | ORAL_TABLET | Freq: Two times a day (BID) | ORAL | 0 refills | Status: AC

## 2016-01-25 MED ORDER — IOPAMIDOL (ISOVUE-300) INJECTION 61%
75.0000 mL | Freq: Once | INTRAVENOUS | Status: AC | PRN
  Administered 2016-01-25: 75 mL via INTRAVENOUS

## 2016-01-25 MED ORDER — PREDNISONE 5 MG PO TABS: 5.0000 mg | ORAL_TABLET | Freq: Every day | ORAL | Status: AC

## 2016-01-25 MED ORDER — IOPAMIDOL (ISOVUE-300) INJECTION 61%
30.0000 mL | Freq: Once | INTRAVENOUS | Status: AC | PRN
  Administered 2016-01-25: 30 mL via ORAL

## 2016-01-25 NOTE — Progress Notes (Addendum)
PROGRESS NOTE    Regina [name]  [id number] DOB: 1945/11/12 DOA: 01/19/2016 PCP: Stephens Shire, MD    Brief Narrative:  70 year old female with history of cervical spondylosis with myelopathy status post spinal fusion, AAA, hypertension, anorexia, COPD, chronic pain on Tylenol# 3 and benzodiazepine who was brought to [facility] long hospital ED by EMS with lethargic, poor by mouth intake progressive for one week and generalized weakness. As per her family patient eating very poorly with increased weakness and lethargic. She was seen by neurologist on 11/30 and an MRI of the brain was ordered but has not been done yet. Patient stopped eating or drinking for several days. Family doubt patient was taking excess of her pain medications and benzodiazepine. She was having nonproductive cough for last few days. At baseline she embolus with a walker. In the ED she was found to be in acute hypoxic respiratory failure, early sepsis with WBC of 21.9K, hemoglobin of 17 and lactic acid of 3.32. Blood glucose is 176. Troponin of 0.06. Chest x-ray showed left basilar infiltrate concerning for pneumonia. ABG showed pH of 7.207, PCO2 of 70 and PO2 of 281 In the ED she was she was placed on BiPAP and started on Narcan drip. Seen by [facility] CM and admitted to ICU. Transfer to hospitalist service next day.  Assessment & Plan:   Active Problems:   Aspiration pneumonia of left lower lobe (HCC)   Pressure injury of skin   Anorexia   Benzodiazepine overdose of undetermined intent   Opioid overdose   Acute respiratory failure with hypoxia and hypercapnia (HCC)   Failure to thrive (0-17)   Acute hypercapnic respiratory failure - Likely secondary to aspiration pneumonia, COPD and effect of narcotics/benzodiazepine. - improved wtih narcan gtt - patient continued on [facility]  Active Problems: Aspiration pneumonia of left lower lobe ([place]) with sepsis present on admission - Patient was started on empiric IV vancomycin  and Zosyn initially.  - Blood culture, urine for strep antigen and Legionella negative thus far - MRSA swab is neg, thus low likelihood for MRSA pna, thus d/c'd vancomycin - Patient has been continued on augmentin - Patient remains afebrile  SIRS ([place]) Secondary to lobar pneumonia. -Sepsis physiology resolved Lactic acid normalized. Leukocytosis normalized, now mildly elevated secondary to steroids -completed course of antibiotics as of 12/10  Acute encephalopathy secondary to Benzodiazepine overdose of undetermined intent/ Opioid overdose - Patient had been on chronic pain medications for cervical spondylosis and arthritis with cervical fusion. - Family deny that patient was overdosing on her narcotic or benzodiazepine. However patient is also severely malnourished and underweight. - Holding sedating medications for now.  - appears near baseline - Have broadened interval of when pain medications can be given  COPD - Continue when necessary nebs.  - without wheezing -cxr with findings of hyperinflation consistent with COPD - O2 now 2LNC. - Patient to complete steroid taper on discharge - continue to wean o2  Severe protein calorie malnutrition and failure to thrive/anorexia - Secondary to poor PO intake - Appreciate input by Nutrition. - tolerating boost  Hypokalemia/hypomagnesemia - replaced - Labs reviewed. Electrolytes remain stable  Initially thought UTI, ruled out - Reviewed UA that was obtained at admission - Mod blood, but no leukocytes, no nitrites, only 0-5 WBC's - Many bacteria on UA, likely asymptomatic bacturia, not UTI - remains stable  Essential hypertension  - Thus far remains off home meds - BP recently elevated and initially restarted home imdur - BP changed to metoprolol per  below  Chronic kidney disease - remains stable  Sacral Decubitus ulcer - WOC consulted at admission - remains stable  Documented bloody stool - Hemoglobin within  normal range - No other issues of GI bleeding reported  - remains stable this am  Leukocytosis - Likely secondary to solumedrol - resolved  Transient sinus tach - asymptomatic at rest - patient is orthostatic - dry mucus membranes on exam - improved somewhat with IVF - Have started metoprolol bid  Orthostatic hypotension -Per above -Patient given IVF hydration  Anorexia - Patient reports chronic weight loss that started after gallbladder surgery - No prior screening colonoscopy - Prior CTA chest from 2014 reviewed with incidental finding of central intrahepatic biliary ductal dilitation. Ductal dilitation likely secondary to cholecystectomy that was done in 1969 - Ordered CT abd/pelvis, pending  DVT prophylaxis: Heparin subQ Code Status: DNR Family Communication: Pt in room, husband at bedside Disposition Plan: transfer to med-tele  Consultants:     Procedures:     Antimicrobials: Anti-infectives    Start     Dose/Rate Route Frequency Ordered Stop   01/22/16 1200  amoxicillin-clavulanate (AUGMENTIN) 500-125 MG per tablet 500 mg     1 tablet Oral 2 times daily 01/22/16 0828     01/21/16 0600  vancomycin (VANCOCIN) IVPB 750 mg/150 ml premix     750 mg 150 mL/hr over 60 Minutes Intravenous  Once 01/21/16 0418 01/21/16 0636   01/20/16 0600  vancomycin (VANCOCIN) 500 mg in sodium chloride 0.9 % 100 mL IVPB     500 mg 100 mL/hr over 60 Minutes Intravenous  Once 01/20/16 0502 01/20/16 0703   01/19/16 1400  piperacillin-tazobactam (ZOSYN) IVPB 2.25 g  Status:  Discontinued     2.25 g 100 mL/hr over 30 Minutes Intravenous Every 8 hours 01/19/16 1043 01/22/16 0828   01/19/16 0545  vancomycin (VANCOCIN) IVPB 1000 mg/200 mL premix     1,000 mg 200 mL/hr over 60 Minutes Intravenous  Once 01/19/16 0539 01/19/16 0904   01/19/16 0545  piperacillin-tazobactam (ZOSYN) IVPB 3.375 g     3.375 g 100 mL/hr over 30 Minutes Intravenous  Once 01/19/16 0539 01/19/16 0655       Subjective: Eager to go to rehab  Objective: Vitals:   01/24/16 2045 01/25/16 0505 01/25/16 0924 01/25/16 1225  BP: 139/81 93/77  (!) 147/83  Pulse: 76 68 72 79  Resp: 18 18 17 16   Temp: 98.1 F (36.7 C) 98.8 F (37.1 C)  98.9 F (37.2 C)  TempSrc: Oral Oral  Oral  SpO2: 97% 97% 96% 96%  Weight:  31.1 kg (68 lb 9 oz)    Height:        Intake/Output Summary (Last 24 hours) at 01/25/16 1739 Last data filed at 01/25/16 0200  Gross per 24 hour  Intake             1100 ml  Output                0 ml  Net             1100 ml   Filed Weights   01/23/16 0637 01/24/16 0426 01/25/16 0505  Weight: 31.4 kg (69 lb 3.6 oz) 31.2 kg (68 lb 12.5 oz) 31.1 kg (68 lb 9 oz)    Examination:  General exam: alert, conversant, in nad Respiratory system: normal resp effort, no audible wheezing Cardiovascular system: regular rhythm, s1, s2 auscultated Gastrointestinal system: pos BS, nondistended Central nervous system: no tremors, no  seizures Extremities: no clubbing, no cyanosis Skin: no rashes, no notable skin lesions seen Psychiatry: affect normal// no auditory hallucinations  Data Reviewed: I have personally reviewed following labs and imaging studies  CBC:  Recent Labs Lab 01/19/16 0500  01/19/16 1527  01/21/16 0317 01/22/16 0321 01/23/16 0500 01/24/16 0534 01/25/16 0527  WBC 21.9*  --  28.2*  < > 14.9* 10.5 14.6* 7.1 6.1  NEUTROABS 19.9*  --  26.3*  --   --   --   --   --   --   HGB 17.2*  < > 15.3*  < > 14.2 13.7 13.2 12.5 13.1  HCT 51.6*  < > 45.9  < > 42.1 41.5 40.6 39.5 41.9  MCV 90.5  --  90.4  < > 89.6 88.1 89.6 93.2 93.7  PLT 255  --  194  < > 149* 179 203 185 166  < > = values in this interval not displayed. Basic Metabolic Panel:  Recent Labs Lab 01/20/16 0331 01/21/16 0317 01/22/16 0321 01/23/16 0500 01/24/16 0534 01/25/16 0527  NA 131* 135 132* 136 139 139  K 3.4* 3.4* 3.6 3.5 4.1 3.8  CL 96* 101 98* 102 105 104  CO2 26 25 29 30 31 29   GLUCOSE  113* 97 139* 128* 145* 157*  BUN 16 14 12 13 15 18   CREATININE 0.57 0.30* 0.31* 0.30* 0.34* 0.34*  CALCIUM 9.2 9.4 9.1 9.4 9.7 10.0  MG 1.4*  --  1.8  --   --   --   PHOS 2.6  --   --   --   --   --    GFR: Estimated Creatinine Clearance: 32.1 mL/min (by C-G formula based on SCr of 0.34 mg/dL (L)). Liver Function Tests:  Recent Labs Lab 01/19/16 0500 01/23/16 0500 01/24/16 0534  AST 32 21 40  ALT 27 20 34  ALKPHOS 88 63 60  BILITOT 1.0 0.5 0.3  PROT 8.3* 5.6* 5.3*  ALBUMIN 4.3 2.5* 2.4*   No results for input(s): LIPASE, AMYLASE in the last 168 hours. No results for input(s): AMMONIA in the last 168 hours. Coagulation Profile: No results for input(s): INR, PROTIME in the last 168 hours. Cardiac Enzymes: No results for input(s): CKTOTAL, CKMB, CKMBINDEX, TROPONINI in the last 168 hours. BNP (last 3 results) No results for input(s): PROBNP in the last 8760 hours. HbA1C: No results for input(s): HGBA1C in the last 72 hours. CBG:  Recent Labs Lab 01/19/16 0534  GLUCAP 155*   Lipid Profile: No results for input(s): CHOL, HDL, LDLCALC, TRIG, CHOLHDL, LDLDIRECT in the last 72 hours. Thyroid Function Tests: No results for input(s): TSH, T4TOTAL, FREET4, T3FREE, THYROIDAB in the last 72 hours. Anemia Panel: No results for input(s): VITAMINB12, FOLATE, FERRITIN, TIBC, IRON, RETICCTPCT in the last 72 hours. Sepsis Labs:  Recent Labs Lab 01/19/16 0622 01/19/16 1012 01/19/16 1401 01/19/16 1527 01/20/16 0331 01/21/16 0317  PROCALCITON  --  0.20  --  0.36 0.38 0.20  LATICACIDVEN 3.32* 2.1* 1.9  --   --   --     Recent Results (from the past 240 hour(s))  MRSA PCR Screening     Status: None   Collection Time: 01/19/16  4:43 AM  Result Value Ref Range Status   MRSA by PCR NEGATIVE NEGATIVE Final    Comment:        The GeneXpert MRSA Assay (FDA approved for NASAL specimens only), is one component of a comprehensive MRSA colonization surveillance program. It  is  not intended to diagnose MRSA infection nor to guide or monitor treatment for MRSA infections.   Culture, blood (routine x 2)     Status: None   Collection Time: 01/19/16  6:00 AM  Result Value Ref Range Status   Specimen Description BLOOD LEFT HAND  Final   Special Requests IN PEDIATRIC BOTTLE [place]  Final   Culture   Final    NO GROWTH 5 DAYS Performed at [facility]    Report Status 01/24/2016 FINAL  Final     Radiology Studies: No results found.  Scheduled Meds: . amoxicillin-clavulanate  1 tablet Oral BID  . feeding supplement  1 Container Oral Q24H  . feeding supplement (ENSURE ENLIVE)  237 mL Oral Q24H  . heparin subcutaneous  2,500 Units Subcutaneous Q12H  . ipratropium  0.5 mg Nebulization BID  . levalbuterol  0.63 mg Nebulization BID  . methylPREDNISolone (SOLU-MEDROL) injection  40 mg Intravenous Q12H  . metoprolol tartrate  12.5 mg Oral BID   Continuous Infusions: . dextrose 5 % and 0.9% NaCl 100 mL/hr at 01/25/16 0725  . naLOXone [facility]) adult infusion for OVERDOSE Stopped (01/19/16 1200)     LOS: 6 days   CHIU, Orpah Melter, MD [facility] Pager [phone]  If 7PM-7AM, please contact night-coverage [email or web address] Password [email or web address] 01/25/2016, 5:39 PM

## 2016-01-25 NOTE — Discharge Summary (Addendum)
Physician Discharge Summary  Regina [name] [id number] DOB: 12/29/1945 DOA: 01/19/2016  PCP: Stephens Shire, MD  Admit date: 01/19/2016 Discharge date: 01/26/2016  Admitted From: Home Disposition:  SNF  Recommendations for Outpatient Follow-up:  1. Follow up with PCP in 1-2 weeks 2. Recommend referral for screening colonoscopy as outpatient 3. Continue to encourage PO intake 4. Recommend repeat BMET and CBC in 1 week  Discharge Condition:Improved CODE STATUS:DNR Diet recommendation:  Dysphagia 3 with thin liquids  Regina [name] Controlled substance registry reviewed. 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 As per her family patient eating very poorly with increased weakness and lethargic. She was seen by neurologist on 11/30 and an MRI of the brain was ordered but has not been done yet. Patient stopped eating or drinking for several days. Family doubt patient was taking excess of her pain medications and benzodiazepine. She was having nonproductive cough for last few days. At baseline she embolus with a walker. In the ED she was found to be in acute hypoxic respiratory failure, early sepsis with WBC of 21.9K, hemoglobin of 17 and lactic acid of 3.32. Blood glucose is 176. Troponin of 0.06. Chest x-ray showed left basilar infiltrate concerning for pneumonia. ABG showed pH of 7.207, PCO2 of 70 and PO2 of 281 In the ED she was she was placed on BiPAP and started on Narcan drip. Seen by [facility] CM and admitted to ICU. Transfer to hospitalist service next day.  Rinse about problem Acute hypercapnic respiratory failure - Likely secondary to aspiration  pneumonia, COPD and effect of narcotics/benzodiazepine. - improved wtih narcan gtt - patient continued on [facility]  Active Problems: Aspiration pneumonia of left lower lobe ([place]) with sepsis present on admission - Patient was started on empiric IV vancomycin and Zosyn initially.  - Blood culture, urine for strep antigen and Legionella negative thus far - MRSA swab is neg, thus low likelihood for MRSA pna, thus d/c'd vancomycin - Patient has completed course of antibiotics.  - Patient remains afebrile  Sepsis with pneumonia Secondary to lobar pneumonia. -Sepsis physiology resolved Lactic acid normalized. Leukocytosis normalized, now mildly elevated secondary to steroids -completed course of antibiotics as of 12/10  Acute encephalopathy secondary to Benzodiazepine overdose of undetermined intent/ Opioid overdose - Patient had been on chronic pain medications for cervical spondylosis and arthritis with cervical fusion. - Family deny that patient was overdosing on her narcotic or benzodiazepine. However patient is also severely malnourished and underweight. - Holding sedating medications for now.  - appears near baseline - Have broadened interval of when pain medications can be given  COPD - Continue when necessary nebs.  - without wheezing -cxr with findings of hyperinflation consistent with COPD - O2 now 2LNC. - Patient to complete steroid taper on discharge - continue to wean o2  Severe protein calorie malnutrition and failure to thrive/anorexia - Secondary to poor PO intake - Appreciate input by Nutrition. - tolerating boost  Hypokalemia/hypomagnesemia - replaced - Labs reviewed. Electrolytes remain stable  Initially thought UTI, ruled out - Reviewed UA that was obtained at admission - Mod blood, but no leukocytes, no nitrites, only 0-5 WBC's - Many bacteria on UA, likely asymptomatic bacturia, not UTI - remains stable  Essential  hypertension  - Thus far  remains off home meds - BP recently elevated and initially restarted home imdur - BP changed to metoprolol per below  Chronic kidney disease - remains stable  Sacral Decubitus ulcer - WOC consulted at admission - remains stable  Documented bloody stool - Hemoglobin within normal range - No other issues of GI bleeding reported  - remains stable this am  Leukocytosis - Likely secondary to solumedrol - resolved  Transient sinus tach - asymptomatic at rest - patient is orthostatic - dry mucus membranes on exam - improved somewhat with IVF - Have started metoprolol bid  Orthostatic hypotension -Per above -Patient given IVF hydration  Anorexia - Patient reports chronic weight loss that started after gallbladder surgery - No prior screening colonoscopy - Prior CTA chest from 2014 reviewed with incidental finding of central intrahepatic biliary ductal dilitation. Ductal dilitation likely secondary to cholecystectomy that was done in 1969 - CT was ordered, largely unremarkable with exception of moderate amount of ascites - Ordered US guided paracentesis was attempted, however no significant fluid was seen that could be tapped  Discharge Diagnoses:  Active Problems:   Aspiration pneumonia of left lower lobe (HCC)   Pressure injury of skin   Anorexia   Benzodiazepine overdose of undetermined intent   Opioid overdose   Acute respiratory failure with hypoxia and hypercapnia (HCC)   Failure to thrive (0-17)    Discharge Instructions     Medication List    STOP taking these medications   isosorbide mononitrate 30 MG 24 hr tablet Commonly known as:  IMDUR   spironolactone 25 MG tablet Commonly known as:  ALDACTONE     TAKE these medications   acetaminophen-codeine 300-30 MG tablet Commonly known as:  TYLENOL #3 Take 1 tablet by mouth every 8 (eight) hours as needed for moderate pain. What changed:  when to take this   albuterol 108 (90 Base) MCG/ACT  inhaler Commonly known as:  PROVENTIL HFA;VENTOLIN HFA Inhale 1-2 puffs into the lungs every 6 (six) hours as needed for wheezing or shortness of breath.   clorazepate 3.75 MG tablet Commonly known as:  TRANXENE Take 1 tablet (3.75 mg total) by mouth 2 (two) times daily as needed for anxiety.   metoprolol tartrate 25 MG tablet Commonly known as:  LOPRESSOR Take 1 tablet (25 mg total) by mouth 2 (two) times daily.   MUCINEX PO Take by mouth as directed.   multivitamin-iron-minerals-folic acid chewable tablet Chew 1 tablet by mouth daily.   predniSONE 5 MG tablet Commonly known as:  DELTASONE Take 1 tablet (5 mg total) by mouth daily with breakfast.      Follow-up Information    BURNETT,BRENT A, MD. Schedule an appointment as soon as possible for a visit in 1 week(s).   Specialty:  Family Medicine Contact information: [address] [place] [address] [phone]          Allergies  Allergen Reactions  . Azithromycin     rash  . Erythromycin Rash    Consultations:    Procedures/Studies: Ct Abdomen Pelvis W Contrast  Result Date: 01/25/2016 CLINICAL DATA:  Weight loss.  Failure to thrive. EXAM: CT ABDOMEN AND PELVIS WITH CONTRAST TECHNIQUE: Multidetector CT imaging of the abdomen and pelvis was performed using the standard protocol following bolus administration of intravenous contrast. CONTRAST:  75mL ISOVUE-300 IOPAMIDOL (ISOVUE-300) INJECTION 61% COMPARISON:  None. FINDINGS: Lower chest: Enlarged heart. Calcific atherosclerotic disease of the coronary arteries. Small hiatal  hernia. Bilateral pleural effusions. Left lower lobe airspace consolidation. Hepatobiliary: No focal liver abnormality is seen. Status post cholecystectomy. No biliary dilatation. Pancreas: Unremarkable. No pancreatic ductal dilatation or surrounding inflammatory changes. Spleen: Normal in size without focal abnormality. Adrenals/Urinary Tract: Adrenal glands are unremarkable.  Kidneys are normal, without renal calculi, focal lesion, or hydronephrosis. Bladder is unremarkable. Stomach/Bowel: Stomach is within normal limits. Appendix appears normal. No evidence of bowel wall thickening, distention, or inflammatory changes. Rectal prolapse is noted. Vascular/Lymphatic: Aortic atherosclerosis, with calcified plaque involving the takeoff of all named abdominal aortic branches. No enlarged abdominal or pelvic lymph nodes. Reproductive: Uterus and bilateral adnexa are unremarkable. Other: Moderate amount of abdominal ascites. Fluid containing right inguinal hernia is noted. Musculoskeletal: Relative osteopenia. IMPRESSION: Abdominal ascites. No evidence of acute abnormality within the solid abdominal organs. No evidence of bowel obstruction. Calcific atherosclerotic disease of the abdominal aorta and its main branches. Left lower lobe airspace consolidation. Bilateral pleural effusions. Osteopenia. Right inguinal hernia containing ascitic fluid. Electronically Signed   By: Fidela Salisbury M.D.   On: 01/25/2016 18:41   Dg Chest Port 1 View  Result Date: 01/20/2016 CLINICAL DATA:  Acute respiratory failure with hypoxia and hypercapnia. Left lower lobe aspiration pneumonia. COPD. EXAM: PORTABLE CHEST 1 VIEW COMPARISON:  01/19/2016 FINDINGS: Marked pulmonary hyperinflation again seen, consistent with COPD. Mild left lower lobe airspace disease shows no significant change. Right lung remains clear. No evidence of pneumothorax or pleural effusion. Heart size is within normal limits. Aortic atherosclerosis. IMPRESSION: Mild left lower lobe airspace disease, without significant change. COPD. Electronically Signed   By: Earle Gell M.D.   On: 01/20/2016 07:14   Dg Chest Portable 1 View  Result Date: 01/19/2016 CLINICAL DATA:  Worsening weakness and lethargy.  Onset 5 days ago. EXAM: PORTABLE CHEST 1 VIEW COMPARISON:  10/14/2014 FINDINGS: New airspace consolidation in the left base, possibly  pneumonia. The right lung is clear. Mild chronic interstitial coarsening. Heart size is normal. Hilar and mediastinal contours are unremarkable and unchanged. Pulmonary vasculature is normal. IMPRESSION: Left base consolidation, suspicious for pneumonia. Followup PA and lateral chest X-ray is recommended in 3-4 weeks following trial of antibiotic therapy to ensure resolution and exclude underlying malignancy. Electronically Signed   By: Andreas Newport M.D.   On: 01/19/2016 05:19    Subjective: Eager to start therapy  Discharge Exam: Vitals:   01/26/16 0637 01/26/16 1413  BP: 133/65 (!) 151/85  Pulse: 65 68  Resp: 18 18  Temp: 98.1 F (36.7 C) 98 F (36.7 C)   Vitals:   01/25/16 2059 01/26/16 0637 01/26/16 0918 01/26/16 1413  BP: 138/74 133/65  (!) 151/85  Pulse: 64 65  68  Resp: 18 18  18   Temp: 98.2 F (36.8 C) 98.1 F (36.7 C)  98 F (36.7 C)  TempSrc: Oral Oral  Oral  SpO2: 97% 98% 95% 95%  Weight:  31 kg (68 lb 5.5 oz)    Height:        General: Pt is alert, awake, not in acute distress Cardiovascular: RRR, S1/S2 +, no rubs, no gallops Respiratory: CTA bilaterally, no wheezing, no rhonchi Abdominal: Soft, NT, ND, bowel sounds + Extremities: no edema, no cyanosis   The results of significant diagnostics from this hospitalization (including imaging, microbiology, ancillary and laboratory) are listed below for reference.     Microbiology: Recent Results (from the past 240 hour(s))  MRSA PCR Screening     Status: None   Collection Time: 01/19/16  4:43  AM  Result Value Ref Range Status   MRSA by PCR NEGATIVE NEGATIVE Final    Comment:        The GeneXpert MRSA Assay (FDA approved for NASAL specimens only), is one component of a comprehensive MRSA colonization surveillance program. It is not intended to diagnose MRSA infection nor to guide or monitor treatment for MRSA infections.   Culture, blood (routine x 2)     Status: None   Collection Time: 01/19/16   6:00 AM  Result Value Ref Range Status   Specimen Description BLOOD LEFT HAND  Final   Special Requests IN PEDIATRIC BOTTLE [place]  Final   Culture   Final    NO GROWTH 5 DAYS Performed at [facility]    Report Status 01/24/2016 FINAL  Final     Labs: BNP (last 3 results) No results for input(s): BNP in the last 8760 hours. Basic Metabolic Panel:  Recent Labs Lab 01/20/16 0331  01/22/16 0321 01/23/16 0500 01/24/16 0534 01/25/16 0527 01/26/16 0527  NA 131*  < > 132* 136 139 139 131*  K 3.4*  < > 3.6 3.5 4.1 3.8 4.0  CL 96*  < > 98* 102 105 104 94*  CO2 26  < > 29 30 31 29  32  GLUCOSE 113*  < > 139* 128* 145* 157* 111*  BUN 16  < > 12 13 15 18 11   CREATININE 0.57  < > 0.31* 0.30* 0.34* 0.34* <0.30*  CALCIUM 9.2  < > 9.1 9.4 9.7 10.0 9.4  MG 1.4*  --  1.8  --   --   --   --   PHOS 2.6  --   --   --   --   --   --   < > = values in this interval not displayed. Liver Function Tests:  Recent Labs Lab 01/23/16 0500 01/24/16 0534  AST 21 40  ALT 20 34  ALKPHOS 63 60  BILITOT 0.5 0.3  PROT 5.6* 5.3*  ALBUMIN 2.5* 2.4*   No results for input(s): LIPASE, AMYLASE in the last 168 hours. No results for input(s): AMMONIA in the last 168 hours. CBC:  Recent Labs Lab 01/21/16 0317 01/22/16 0321 01/23/16 0500 01/24/16 0534 01/25/16 0527  WBC 14.9* 10.5 14.6* 7.1 6.1  HGB 14.2 13.7 13.2 12.5 13.1  HCT 42.1 41.5 40.6 39.5 41.9  MCV 89.6 88.1 89.6 93.2 93.7  PLT 149* 179 203 185 166   Cardiac Enzymes: No results for input(s): CKTOTAL, CKMB, CKMBINDEX, TROPONINI in the last 168 hours. BNP: Invalid input(s): POCBNP CBG: No results for input(s): GLUCAP in the last 168 hours. D-Dimer No results for input(s): DDIMER in the last 72 hours. Hgb A1c No results for input(s): HGBA1C in the last 72 hours. Lipid Profile No results for input(s): CHOL, HDL, LDLCALC, TRIG, CHOLHDL, LDLDIRECT in the last 72 hours. Thyroid function studies No results for input(s): TSH,  T4TOTAL, T3FREE, THYROIDAB in the last 72 hours.  Invalid input(s): FREET3 Anemia work up No results for input(s): VITAMINB12, FOLATE, FERRITIN, TIBC, IRON, RETICCTPCT in the last 72 hours. Urinalysis    Component Value Date/Time   COLORURINE YELLOW 01/19/2016 0829   APPEARANCEUR CLOUDY (A) 01/19/2016 0829   LABSPEC 1.026 01/19/2016 0829   PHURINE 5.0 01/19/2016 0829   GLUCOSEU 500 (A) 01/19/2016 0829   HGBUR MODERATE (A) 01/19/2016 0829   BILIRUBINUR NEGATIVE 01/19/2016 0829   KETONESUR NEGATIVE 01/19/2016 0829   PROTEINUR 30 (A) 01/19/2016 [id number]  NITRITE NEGATIVE 01/19/2016 0829   LEUKOCYTESUR NEGATIVE 01/19/2016 0829   Sepsis Labs Invalid input(s): PROCALCITONIN,  WBC,  LACTICIDVEN Microbiology Recent Results (from the past 240 hour(s))  MRSA PCR Screening     Status: None   Collection Time: 01/19/16  4:43 AM  Result Value Ref Range Status   MRSA by PCR NEGATIVE NEGATIVE Final    Comment:        The GeneXpert MRSA Assay (FDA approved for NASAL specimens only), is one component of a comprehensive MRSA colonization surveillance program. It is not intended to diagnose MRSA infection nor to guide or monitor treatment for MRSA infections.   Culture, blood (routine x 2)     Status: None   Collection Time: 01/19/16  6:00 AM  Result Value Ref Range Status   Specimen Description BLOOD LEFT HAND  Final   Special Requests IN PEDIATRIC BOTTLE [place]  Final   Culture   Final    NO GROWTH 5 DAYS Performed at [facility]    Report Status 01/24/2016 FINAL  Final     SIGNED:   Donne Hazel, MD  [facility] 01/26/2016, 3:38 PM  If 7PM-7AM, please contact night-coverage [email or web address] Password [email or web address]

## 2016-01-25 NOTE — Clinical Social Work Note (Signed)
LCSW spoke with MD who reported that patient should be ready to discharge today after receiving her cat scan.  Cat scans are behind so not sure when this will occur.  LCSW spoke with [facility] to assess for discharge to SNF this evening provided all is well with the cat scan.  [name] at [facility] asked to be called at [phone] if patient is ready for discharge tonight and she will provide a fax number for the discharge summary to be faxed.  Per MD if something shows on the cat scan patient will not discharge this evening.Paperwork has already been completed by patient's husband and he wants to be called if patient is discharging this evening.  Dede Query, LCSW [place] Worker - Weekend Coverage cell #: [phone]

## 2016-01-26 ENCOUNTER — Inpatient Hospital Stay (HOSPITAL_COMMUNITY): Payer: Medicare Other

## 2016-01-26 LAB — BASIC METABOLIC PANEL
Anion gap: 5 (ref 5–15)
BUN: 11 mg/dL (ref 6–20)
CHLORIDE: 94 mmol/L — AB (ref 101–111)
CO2: 32 mmol/L (ref 22–32)
Calcium: 9.4 mg/dL (ref 8.9–10.3)
Creatinine, Ser: <0.3 mg/dL — ABNORMAL LOW (ref 0.44–1.00)
Glucose, Bld: 111 mg/dL — ABNORMAL HIGH (ref 65–99)
POTASSIUM: 4 mmol/L (ref 3.5–5.1)
SODIUM: 131 mmol/L — AB (ref 135–145)

## 2016-01-26 MED ORDER — ALBUTEROL SULFATE HFA 108 (90 BASE) MCG/ACT IN AERS: 1.0000 | INHALATION_SPRAY | Freq: Four times a day (QID) | RESPIRATORY_TRACT | 0 refills | Status: AC | PRN

## 2016-01-26 NOTE — Progress Notes (Signed)
Patient ID: Regina [name], female   DOB: 1945/08/11, 70 y.o.   MRN: [id number] Pt presented to [place] dept today for paracentesis. On limited US abd in all four quadrants there is only a trace amount of ascites present which is not safely accessible for aspiration at this time due to proximity to bowel loops/liver. Procedure cancelled. Pt/MD informed.

## 2016-01-26 NOTE — Clinical Social Work Note (Signed)
Per MD, possible discharge for today, 12/11. Patient has bed at [facility]. Facility aware of possible dc for today and prepared to admit patient. Husband at bedside and aware of dc plans.   Admissions paperwork completed.   MSW remains available as needed.    Glendon Axe, MSW [phone] 01/26/2016 1:21 PM

## 2016-01-26 NOTE — Progress Notes (Signed)
Physical Therapy Treatment Patient Details Name: Regina [name] MRN: [id number] DOB: 03-Aug-1945 Today's Date: 01/26/2016    History of Present Illness 70 y/o F with PMH of arthritis, cervical spondylosis with myelopathy s/p spinal fusion, CKD, AAA, cholecystectomy, HTN, anorexia  and COPD who presented to [facility] on 12/4 via EMS with reports of lethargy, poor intake and generalized weakness    PT Comments    The patient is participating more with active assistive exercises with U/LE's. Plans to go to SNF today, possibly.   Follow Up Recommendations  SNF;Supervision/Assistance - 24 hour;Home health PT     Equipment Recommendations  Wheelchair (measurements PT);Hospital bed    Recommendations for Other Services       Precautions / Restrictions Precautions Precautions: Fall Precaution Comments: multiple skin breakdown sites, do not "pull on the neck"- fused.    Mobility  Bed Mobility   Bed Mobility: Rolling Rolling: Max assist         General bed mobility comments: cues to flex knees which she di, multimodal cue to reach for the rail to roll to right and left. patie declined  to sit up  Transfers                    Ambulation/Gait                 Stairs            Wheelchair Mobility    Modified Rankin (Stroke Patients Only)       Balance                                    Cognition Arousal/Alertness: Awake/alert   Overall Cognitive Status: Difficult to assess                 General Comments: patient appears aware of environment.     Exercises General Exercises - Upper Extremity Shoulder Flexion: AAROM;10 reps;Both;Supine Shoulder Extension: AAROM;Both;10 reps;Supine Shoulder ABduction: 10 reps;Supine Elbow Extension: AAROM;Both;10 reps;Supine General Exercises - Lower Extremity Ankle Circles/Pumps: AAROM;Both;10 reps;Supine Short Arc Quad: AAROM;Both;10 reps;Supine Heel Slides: AAROM;Both;10 reps;Supine Hip  ABduction/ADduction: AAROM;Both;10 reps;Supine    General Comments        Pertinent Vitals/Pain Pain Score: 0-No pain    Home Living                      Prior Function            PT Goals (current goals can now be found in the care plan section) Progress towards PT goals: Progressing toward goals    Frequency    Min 3X/week      PT Plan Current plan remains appropriate    Co-evaluation             End of Session   Activity Tolerance: Patient tolerated treatment well Patient left: in bed;with call bell/phone within reach;with bed alarm set     Time: 1323-1404 PT Time Calculation (min) (ACUTE ONLY): 41 min  Charges:  $Therapeutic Exercise: 23-37 mins $Self Care/Home Management: 10-17-2022                    G Codes:      Claretha Cooper 01/26/2016, 2:08 PM

## 2016-01-26 NOTE — Clinical Social Work Placement (Signed)
Medical Social Worker facilitated patient discharge including contacting patient family and facility to confirm patient discharge plans.  Clinical information faxed to facility and family agreeable with plan.  MSW arranged ambulance transport via [facility] to [facility].  RN to call report prior to discharge.  Medical Social Worker will sign off for now as social work intervention is no longer needed. Please consult [place] again if new need arises.  CLINICAL SOCIAL WORK PLACEMENT  NOTE  Date:  01/26/2016  Patient Details  Name: Regina [name] MRN: [id number] Date of Birth: [birth year]  Clinical Social Work is seeking post-discharge placement for this patient at the [place] level of care (*CSW will initial, date and re-position this form in  chart as items are completed):  Yes   Patient/family provided with [place] Work Department's list of facilities offering this level of care within the geographic area requested by the patient (or if unable, by the patient's family).  Yes   Patient/family informed of their freedom to choose among providers that offer the needed level of care, that participate in Medicare, Medicaid or managed care program needed by the patient, have an available bed and are willing to accept the patient.  Yes   Patient/family informed of [place]'s ownership interest in [facility] and [facility], as well as of the fact that they are under no obligation to receive care at these facilities.  PASRR submitted to EDS on 01/23/16     PASRR number received on 01/23/16     Existing PASRR number confirmed on       FL2 transmitted to all facilities in geographic area requested by pt/family on 01/23/16     FL2 transmitted to all facilities within larger geographic area on       Patient informed that his/her managed care company has contracts with or will negotiate with certain facilities, including the following:         Yes   Patient/family informed of bed offers received.  Patient chooses bed at [facility]     Physician recommends and patient chooses bed at      Patient to be transferred to [facility] on 01/26/16.  Patient to be transferred to facility by  Regina [name] )     Patient family notified on 01/26/16 of transfer.  Name of family member notified:   (Pt's husband, Regina [name] )     PHYSICIAN Please prepare priority discharge summary, including medications     Additional Comment:    _______________________________________________ Regina [name] A 01/26/2016, 3:58 PM

## 2016-01-26 NOTE — Care Management Note (Signed)
Case Management Note  Patient Details  Name: Regina [name] MRN: [id number] Date of Birth: 1945/05/20  Subjective/Objective:                   Action/Plan:d/c SNF.   Expected Discharge Date:   (unknown)               Expected Discharge Plan:  Skilled Nursing Facility  In-House Referral:  Clinical Social Work, Nutrition  Discharge planning Services  CM Consult  Post Acute Care Choice:    Choice offered to:     DME Arranged:    DME Agency:     HH Arranged:    [place] Agency:     Status of Service:  Completed, signed off  If discussed at [employer], dates discussed:    Additional Comments:  Dessa Phi, RN 01/26/2016, 1:23 PM

## 2016-02-12 ENCOUNTER — Other Ambulatory Visit: Payer: Self-pay | Admitting: Thoracic Surgery (Cardiothoracic Vascular Surgery)

## 2016-02-12 DIAGNOSIS — I712 Thoracic aortic aneurysm, without rupture: Secondary | ICD-10-CM

## 2016-02-12 DIAGNOSIS — I7121 Aneurysm of the ascending aorta, without rupture: Secondary | ICD-10-CM

## 2016-03-05 ENCOUNTER — Telehealth: Payer: Self-pay | Admitting: Neurology

## 2016-03-05 NOTE — Telephone Encounter (Signed)
FYI- [facility] called to notify office patient is in rehab per patients husband and will not be scheduling MRI.

## 2016-03-09 ENCOUNTER — Other Ambulatory Visit: Payer: Self-pay | Admitting: *Deleted

## 2016-03-09 ENCOUNTER — Ambulatory Visit: Payer: Medicare Other | Admitting: Thoracic Surgery (Cardiothoracic Vascular Surgery)

## 2016-03-09 NOTE — Patient Outreach (Signed)
[facility]) Care Management  03/09/2016  Nayeliz Hipp 02-18-1945 [id number]   Met with SW at facility, she states that patient spouse plans for patient to remain at facility under private pay at this time. Requested SW to let RNCM know if patient goes home and [facility] can discuss [facility] care management with patient and spouse, she agrees.  Plan to sign off at this time, requested to be consulted again for future discharge needs.   Royetta Crochet. Laymond Purser, RN, BSN, [place] [phone]) Business Cell  [phone]) Toll Free Office

## 2016-04-15 DEATH — deceased

## 2017-10-11 IMAGING — CT CT ABD-PELV W/ CM
2 of 5 series · 15 of 46 positions shown, 17 images · IV contrast (APPLIED)
Comparison: None.

CLINICAL DATA: Weight loss.  Failure to thrive.

EXAM:
CT ABDOMEN AND PELVIS WITH CONTRAST
TECHNIQUE: Multidetector CT imaging of the abdomen and pelvis was performed
using the standard protocol following bolus administration of
intravenous contrast.
CONTRAST:  75mL RNGTHR-D11 IOPAMIDOL (RNGTHR-D11) INJECTION 61%

[Series 3: axial st · axial · 0.69mm/px · z∈[+1171,+1541]mm · 12 of 84 slices shown, 14 images]
[im 5/84  soft-tissue]
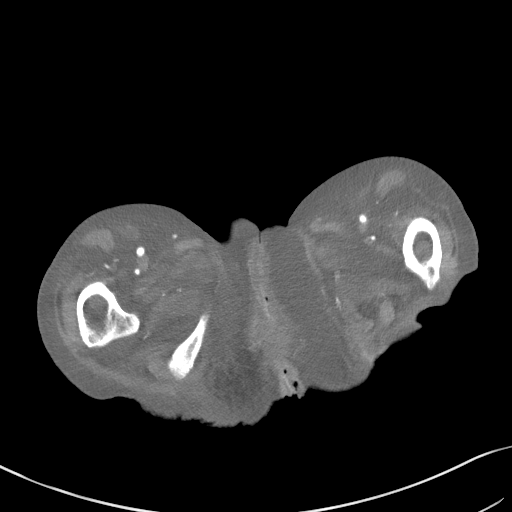
[im 5/84  bone]
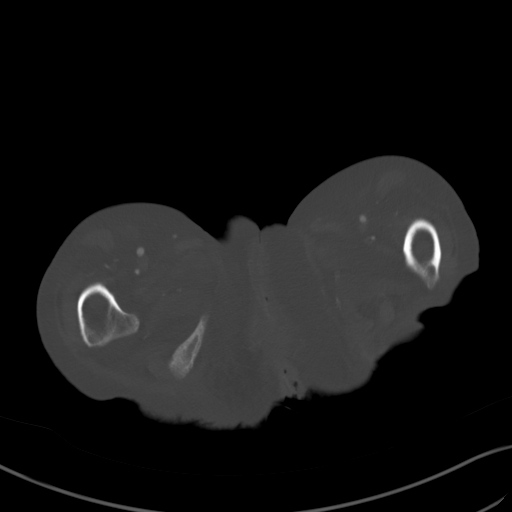
[im 14/84  soft-tissue]
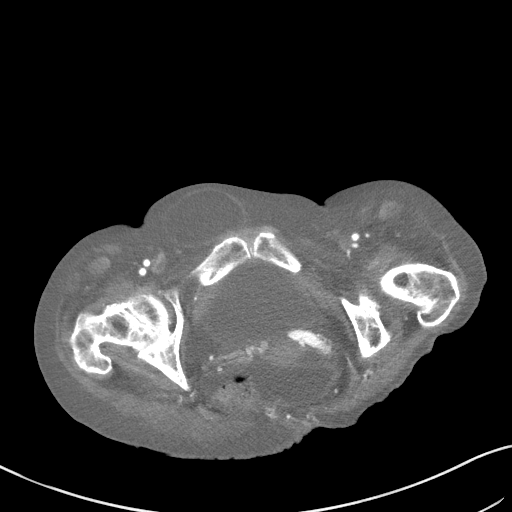
[im 19/84  soft-tissue]
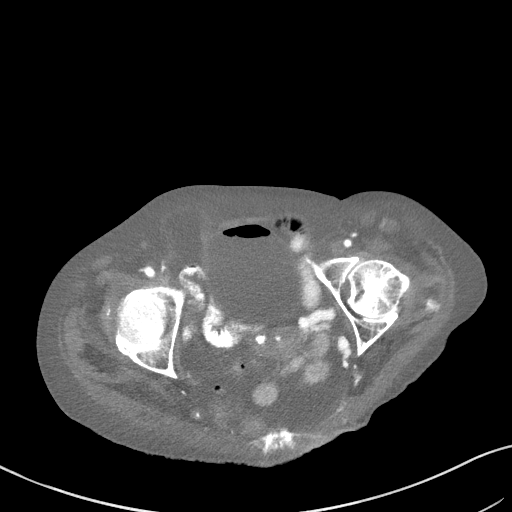
[im 24/84  soft-tissue]
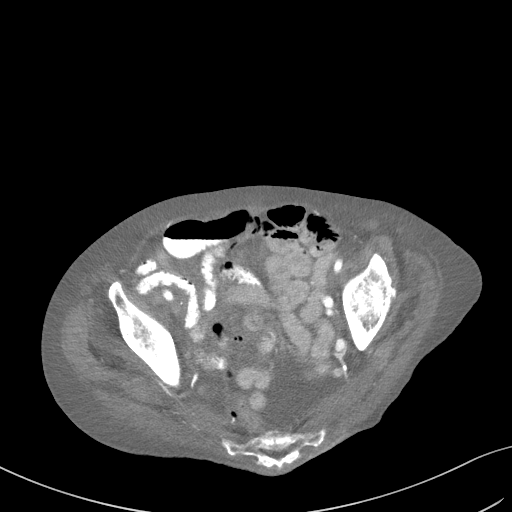
[im 33/84  soft-tissue]
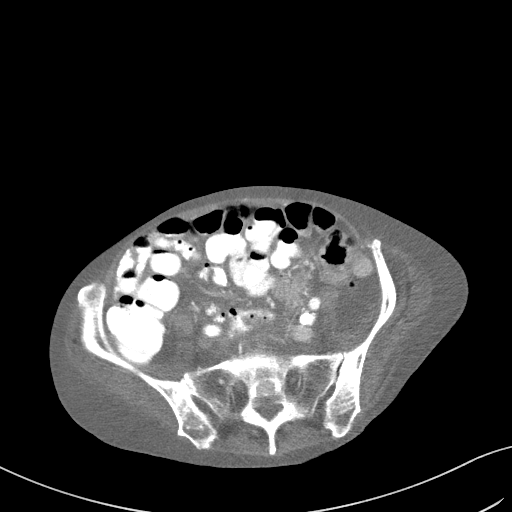
[im 37/84  soft-tissue]
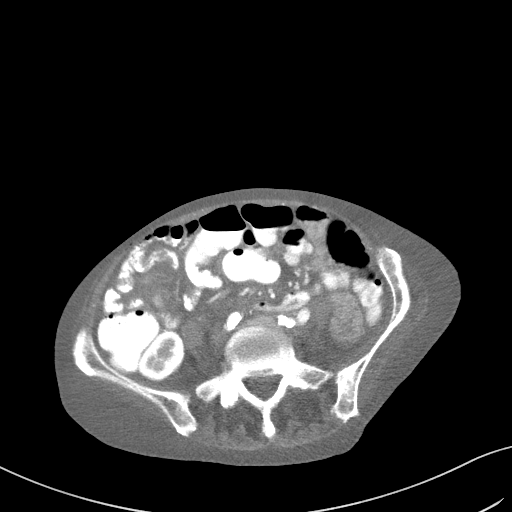
[im 47/84  soft-tissue]
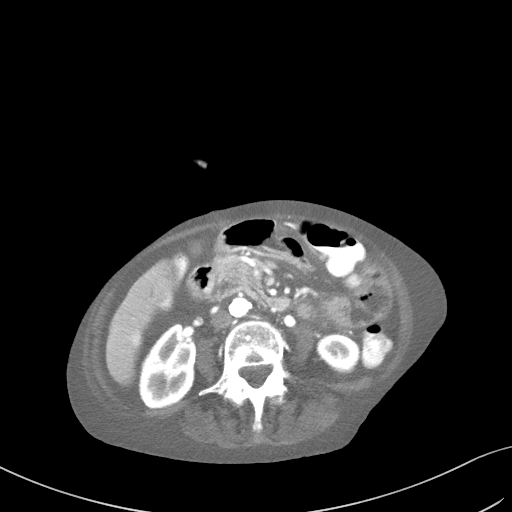
[im 51/84  soft-tissue]
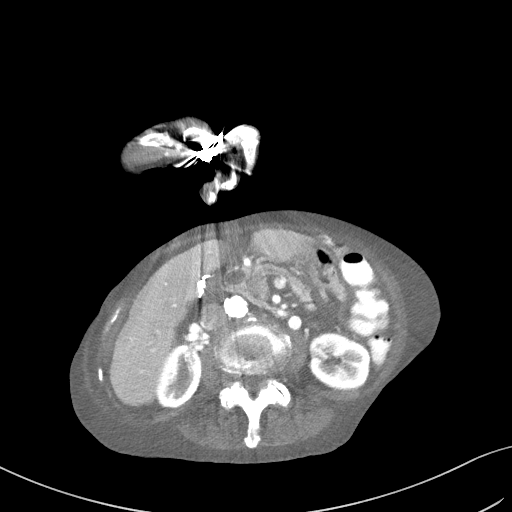
[im 60/84  soft-tissue]
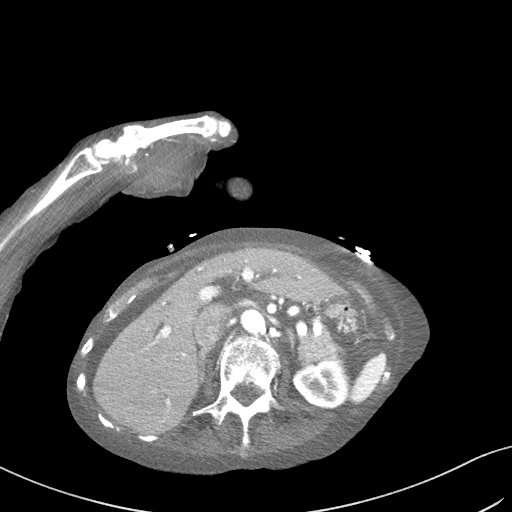
[im 60/84  bone]
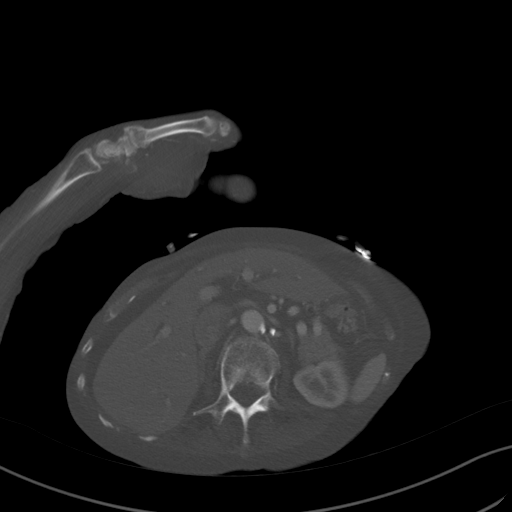
[im 65/84  soft-tissue]
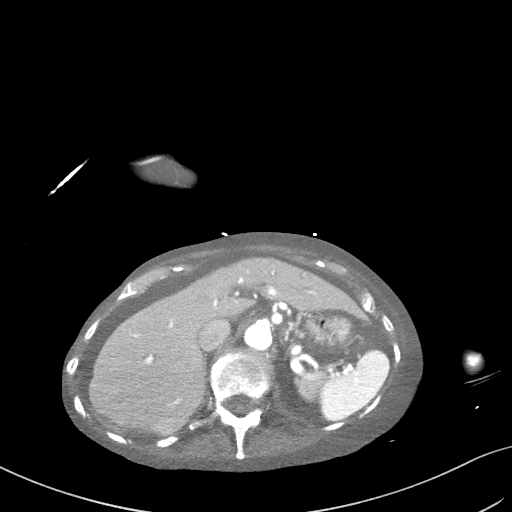
[im 70/84  soft-tissue]
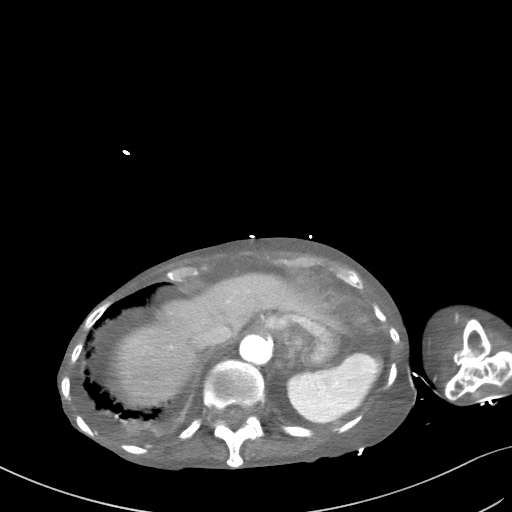
[im 79/84  soft-tissue]
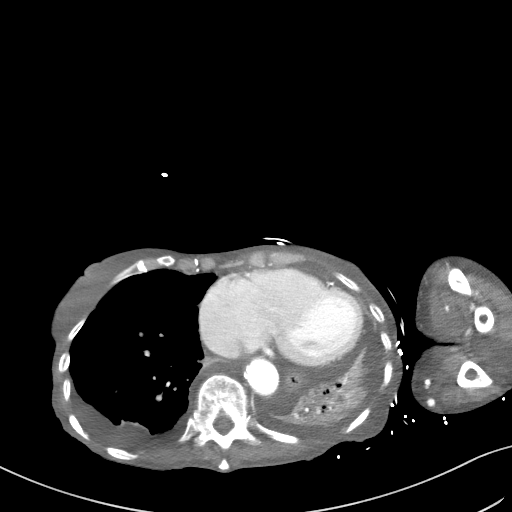

[Series 5: coronal st · coronal · 0.56mm/px · 3 of 69 slices shown]
[im 23/69  soft-tissue]
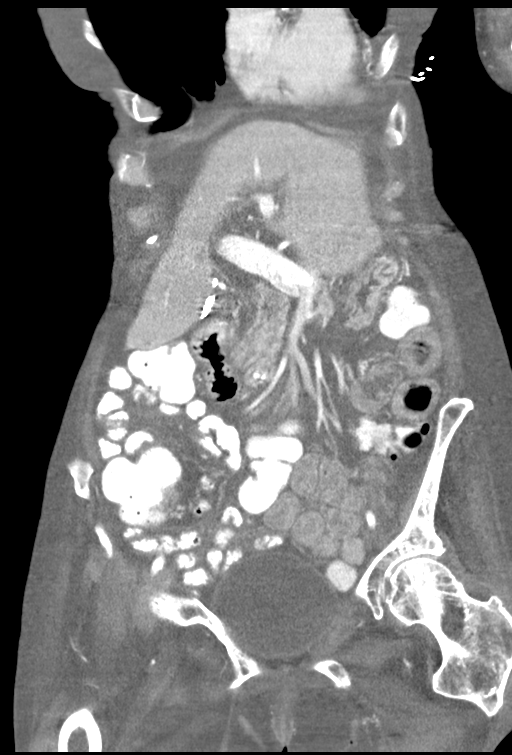
[im 31/69  soft-tissue]
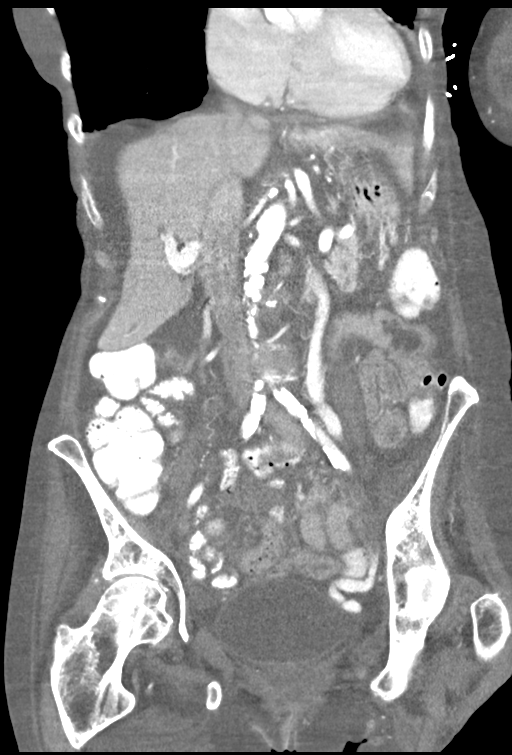
[im 38/69  soft-tissue]
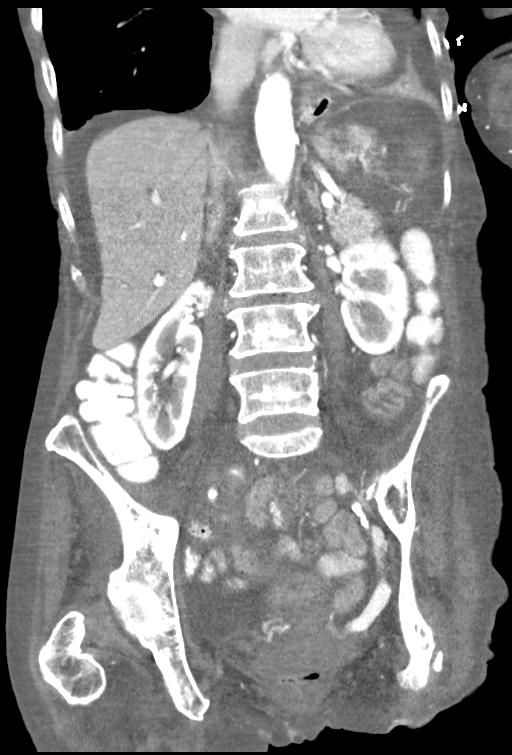

[15 of 46 positions shown; findings below may reference images not displayed]

FINDINGS: Lower chest: Enlarged heart. Calcific atherosclerotic disease of the
coronary arteries. Small hiatal hernia. Bilateral pleural effusions.
Left lower lobe airspace consolidation.

Hepatobiliary: No focal liver abnormality is seen. Status post
cholecystectomy. No biliary dilatation.

Pancreas: Unremarkable. No pancreatic ductal dilatation or
surrounding inflammatory changes.

Spleen: Normal in size without focal abnormality.

Adrenals/Urinary Tract: Adrenal glands are unremarkable. Kidneys are
normal, without renal calculi, focal lesion, or hydronephrosis.
Bladder is unremarkable.

Stomach/Bowel: Stomach is within normal limits. Appendix appears
normal. No evidence of bowel wall thickening, distention, or
inflammatory changes. Rectal prolapse is noted.

Vascular/Lymphatic: Aortic atherosclerosis, with calcified plaque
involving the takeoff of all named abdominal aortic branches. No
enlarged abdominal or pelvic lymph nodes.

Reproductive: Uterus and bilateral adnexa are unremarkable.

Other: Moderate amount of abdominal ascites. Fluid containing right
inguinal hernia is noted.

Musculoskeletal: Relative osteopenia.
IMPRESSION: Abdominal ascites.

No evidence of acute abnormality within the solid abdominal organs.

No evidence of bowel obstruction.

Calcific atherosclerotic disease of the abdominal aorta and its main
branches.

Left lower lobe airspace consolidation.

Bilateral pleural effusions.

Osteopenia.

Right inguinal hernia containing ascitic fluid.
